# Patient Record
Sex: Female | Born: 1935 | Race: White | Hispanic: No | Smoking: Former smoker
Health system: Southern US, Community
[De-identification: ages and names within clinical notes are randomized; demographics above are authoritative.]

## PROBLEM LIST (undated history)

## (undated) DIAGNOSIS — H409 Unspecified glaucoma: Secondary | ICD-10-CM

## (undated) DIAGNOSIS — K219 Gastro-esophageal reflux disease without esophagitis: Secondary | ICD-10-CM

## (undated) DIAGNOSIS — I10 Essential (primary) hypertension: Secondary | ICD-10-CM

## (undated) DIAGNOSIS — E079 Disorder of thyroid, unspecified: Secondary | ICD-10-CM

## (undated) DIAGNOSIS — H3530 Unspecified macular degeneration: Secondary | ICD-10-CM

## (undated) HISTORY — PX: ABDOMINAL HYSTERECTOMY: SHX81

## (undated) HISTORY — PX: TONSILLECTOMY: SUR1361

## (undated) HISTORY — DX: Gastro-esophageal reflux disease without esophagitis: K21.9

## (undated) HISTORY — DX: Essential (primary) hypertension: I10

## (undated) HISTORY — DX: Disorder of thyroid, unspecified: E07.9

---

## 2002-08-04 ENCOUNTER — Other Ambulatory Visit: Admission: RE | Admit: 2002-08-04 | Discharge: 2002-08-04 | Payer: Self-pay | Admitting: Family Medicine

## 2004-12-03 ENCOUNTER — Ambulatory Visit: Payer: Self-pay | Admitting: Family Medicine

## 2004-12-14 ENCOUNTER — Ambulatory Visit: Payer: Self-pay | Admitting: Internal Medicine

## 2004-12-24 ENCOUNTER — Ambulatory Visit: Payer: Self-pay | Admitting: Family Medicine

## 2004-12-27 ENCOUNTER — Ambulatory Visit: Payer: Self-pay | Admitting: Family Medicine

## 2005-01-11 ENCOUNTER — Ambulatory Visit: Payer: Self-pay | Admitting: Family Medicine

## 2006-04-03 ENCOUNTER — Ambulatory Visit: Payer: Self-pay | Admitting: Family Medicine

## 2006-04-04 ENCOUNTER — Ambulatory Visit: Payer: Self-pay | Admitting: Family Medicine

## 2006-04-23 ENCOUNTER — Ambulatory Visit: Payer: Self-pay | Admitting: Family Medicine

## 2006-07-30 ENCOUNTER — Ambulatory Visit: Payer: Self-pay | Admitting: Family Medicine

## 2007-04-09 ENCOUNTER — Ambulatory Visit: Payer: Self-pay | Admitting: Family Medicine

## 2008-05-31 ENCOUNTER — Ambulatory Visit: Payer: Self-pay | Admitting: Family Medicine

## 2009-07-20 ENCOUNTER — Ambulatory Visit: Payer: Self-pay | Admitting: Family Medicine

## 2010-03-24 ENCOUNTER — Emergency Department: Payer: Self-pay | Admitting: Internal Medicine

## 2010-03-30 ENCOUNTER — Ambulatory Visit: Payer: Self-pay | Admitting: Ophthalmology

## 2010-09-25 ENCOUNTER — Ambulatory Visit: Payer: Self-pay | Admitting: Family Medicine

## 2011-04-29 ENCOUNTER — Inpatient Hospital Stay: Payer: Self-pay | Admitting: Internal Medicine

## 2011-05-07 LAB — PATHOLOGY REPORT

## 2012-01-01 ENCOUNTER — Ambulatory Visit: Payer: Self-pay | Admitting: Family Medicine

## 2013-06-04 ENCOUNTER — Ambulatory Visit: Payer: Self-pay | Admitting: Family Medicine

## 2013-11-01 ENCOUNTER — Ambulatory Visit: Payer: Self-pay | Admitting: Podiatry

## 2013-11-04 ENCOUNTER — Encounter: Payer: Self-pay | Admitting: Podiatry

## 2013-11-08 ENCOUNTER — Ambulatory Visit (INDEPENDENT_AMBULATORY_CARE_PROVIDER_SITE_OTHER): Payer: Medicare Other | Admitting: Podiatry

## 2013-11-08 ENCOUNTER — Encounter: Payer: Self-pay | Admitting: Podiatry

## 2013-11-08 VITALS — BP 151/72 | HR 70 | Resp 18 | Ht 64.0 in | Wt 143.0 lb

## 2013-11-08 DIAGNOSIS — M79609 Pain in unspecified limb: Secondary | ICD-10-CM

## 2013-11-08 DIAGNOSIS — B351 Tinea unguium: Secondary | ICD-10-CM

## 2013-11-08 NOTE — Progress Notes (Signed)
Brittany Blackwell presents today with a chief complaint of painful elongated toenails bilateral. ° °Objective: Pulses remain palpable bilateral nails are thick yellow dystrophic clinically mycotic. ° °Assessment: Pain in limb secondary onychomycosis 1 through 5 bilateral. ° °Plan: Debridement of nails 1 through 5 bilateral followup with her in 3 months. °

## 2014-02-07 ENCOUNTER — Ambulatory Visit (INDEPENDENT_AMBULATORY_CARE_PROVIDER_SITE_OTHER): Payer: Medicare HMO | Admitting: Podiatry

## 2014-02-07 VITALS — BP 146/73 | HR 80 | Resp 16 | Ht 64.0 in | Wt 145.0 lb

## 2014-02-07 DIAGNOSIS — M79609 Pain in unspecified limb: Secondary | ICD-10-CM

## 2014-02-07 DIAGNOSIS — B351 Tinea unguium: Secondary | ICD-10-CM

## 2014-02-07 NOTE — Progress Notes (Signed)
She presents today with a chief complaint of painful toenails one through 5 bilateral.  Objective: Vital signs are stable she is alert and oriented x3. Nails are thick yellow dystrophic onychomycotic and painful palpation as well as debridement.  Assessment: Pain in limb secondary to onychomycosis 1 through 5 bilateral.  Plan: Debridement of nails 1 through 5 bilateral covered service secondary to pain.

## 2014-05-04 ENCOUNTER — Encounter: Payer: Self-pay | Admitting: Podiatry

## 2014-05-04 ENCOUNTER — Ambulatory Visit (INDEPENDENT_AMBULATORY_CARE_PROVIDER_SITE_OTHER): Payer: Medicare HMO | Admitting: Podiatry

## 2014-05-04 VITALS — BP 120/86 | HR 76 | Resp 18

## 2014-05-04 DIAGNOSIS — M79609 Pain in unspecified limb: Secondary | ICD-10-CM

## 2014-05-04 DIAGNOSIS — B351 Tinea unguium: Secondary | ICD-10-CM

## 2014-05-04 NOTE — Progress Notes (Signed)
She presents today with a chief complaint of painful elongated toenails one through 5 bilateral.  Objective: Nails are thick yellow dystrophic onychomycotic and painful palpation. Pulses are palpable bilateral.  Assessment: Pain in limb secondary to onychomycosis 1 through 5 bilateral.  Plan: Debridement of nails 1 through 5 bilateral covered service secondary to pain.

## 2014-08-01 ENCOUNTER — Ambulatory Visit (INDEPENDENT_AMBULATORY_CARE_PROVIDER_SITE_OTHER): Payer: Medicare HMO | Admitting: Podiatry

## 2014-08-01 DIAGNOSIS — M79676 Pain in unspecified toe(s): Secondary | ICD-10-CM

## 2014-08-01 DIAGNOSIS — B351 Tinea unguium: Secondary | ICD-10-CM

## 2014-08-01 DIAGNOSIS — M79609 Pain in unspecified limb: Secondary | ICD-10-CM

## 2014-08-01 NOTE — Progress Notes (Signed)
She presents today with a chief complaint of painful elongated toenails one through 5 bilateral.  Objective: Nails are thick yellow dystrophic onychomycotic and painful palpation.  Assessment: Pain in limb secondary to onychomycosis 1 through 5 bilateral.  Plan: Debridement nails 1 through 5 bilateral.

## 2014-11-02 ENCOUNTER — Ambulatory Visit: Payer: Medicare HMO | Admitting: Podiatry

## 2014-11-02 ENCOUNTER — Ambulatory Visit: Payer: Commercial Managed Care - HMO | Admitting: Podiatry

## 2014-11-14 ENCOUNTER — Ambulatory Visit: Payer: Commercial Managed Care - HMO | Admitting: Podiatry

## 2014-11-15 ENCOUNTER — Ambulatory Visit: Payer: Self-pay | Admitting: Family Medicine

## 2014-11-23 ENCOUNTER — Ambulatory Visit (INDEPENDENT_AMBULATORY_CARE_PROVIDER_SITE_OTHER): Payer: Commercial Managed Care - HMO | Admitting: Podiatry

## 2014-11-23 DIAGNOSIS — B351 Tinea unguium: Secondary | ICD-10-CM

## 2014-11-23 DIAGNOSIS — M79676 Pain in unspecified toe(s): Secondary | ICD-10-CM

## 2014-11-23 NOTE — Progress Notes (Signed)
Joory presents today with a chief complaint of painful elongated toenails bilateral. ° °Objective: Pulses remain palpable bilateral nails are thick yellow dystrophic clinically mycotic. ° °Assessment: Pain in limb secondary onychomycosis 1 through 5 bilateral. ° °Plan: Debridement of nails 1 through 5 bilateral followup with her in 3 months. °

## 2015-02-27 ENCOUNTER — Other Ambulatory Visit: Payer: Commercial Managed Care - HMO

## 2015-02-27 ENCOUNTER — Ambulatory Visit: Payer: Commercial Managed Care - HMO | Admitting: Podiatry

## 2015-04-10 ENCOUNTER — Ambulatory Visit (INDEPENDENT_AMBULATORY_CARE_PROVIDER_SITE_OTHER): Payer: PPO | Admitting: Podiatry

## 2015-04-10 DIAGNOSIS — B351 Tinea unguium: Secondary | ICD-10-CM

## 2015-04-10 DIAGNOSIS — M79676 Pain in unspecified toe(s): Secondary | ICD-10-CM

## 2015-04-10 NOTE — Progress Notes (Signed)
Brittany Blackwell presents today with a chief complaint of painful elongated toenails bilateral.  Objective: Pulses remain palpable bilateral nails are thick yellow dystrophic clinically mycotic.  Assessment: Pain in limb secondary onychomycosis 1 through 5 bilateral.  Plan: Debridement of nails 1 through 5 bilateral followup with her in 3 months.

## 2015-07-31 ENCOUNTER — Ambulatory Visit: Payer: PPO

## 2015-09-13 ENCOUNTER — Encounter: Payer: Self-pay | Admitting: Podiatry

## 2015-09-13 ENCOUNTER — Ambulatory Visit (INDEPENDENT_AMBULATORY_CARE_PROVIDER_SITE_OTHER): Payer: PPO | Admitting: Podiatry

## 2015-09-13 DIAGNOSIS — B351 Tinea unguium: Secondary | ICD-10-CM | POA: Diagnosis not present

## 2015-09-13 DIAGNOSIS — M79676 Pain in unspecified toe(s): Secondary | ICD-10-CM | POA: Diagnosis not present

## 2015-09-13 NOTE — Progress Notes (Signed)
She presents today with chief complaint of painful elongated toenails.  Objective: Vital signs are stable she is alert and oriented 3. Pulses are strongly palpable bilateral. Her nails are thick yellow dystrophic onychomycotic and painful on palpation was sharply incurvated nail margins.  Assessment: Pain in limb secondary to onychomycosis and sharp incurvated nail margins.  Plan: Discussed etiology pathology conservative versus surgical therapies. Debrided nails 1 through 5 bilateral is a covered service secondary to pain.

## 2015-12-13 ENCOUNTER — Ambulatory Visit: Payer: PPO | Admitting: Podiatry

## 2016-01-03 ENCOUNTER — Ambulatory Visit (INDEPENDENT_AMBULATORY_CARE_PROVIDER_SITE_OTHER): Payer: PPO | Admitting: Podiatry

## 2016-01-03 ENCOUNTER — Encounter: Payer: Self-pay | Admitting: Podiatry

## 2016-01-03 DIAGNOSIS — M79674 Pain in right toe(s): Secondary | ICD-10-CM | POA: Diagnosis not present

## 2016-01-03 DIAGNOSIS — E039 Hypothyroidism, unspecified: Secondary | ICD-10-CM | POA: Insufficient documentation

## 2016-01-03 DIAGNOSIS — H3530 Unspecified macular degeneration: Secondary | ICD-10-CM | POA: Insufficient documentation

## 2016-01-03 DIAGNOSIS — B351 Tinea unguium: Secondary | ICD-10-CM

## 2016-01-03 DIAGNOSIS — E079 Disorder of thyroid, unspecified: Secondary | ICD-10-CM | POA: Insufficient documentation

## 2016-01-03 DIAGNOSIS — M79675 Pain in left toe(s): Secondary | ICD-10-CM | POA: Diagnosis not present

## 2016-01-03 DIAGNOSIS — I10 Essential (primary) hypertension: Secondary | ICD-10-CM | POA: Insufficient documentation

## 2016-01-03 DIAGNOSIS — B019 Varicella without complication: Secondary | ICD-10-CM | POA: Insufficient documentation

## 2016-01-03 NOTE — Progress Notes (Signed)
She presents today with chief complaint of painful elongated toenails.  Objective: Vital signs are stable alert and oriented 3. Pulses are palpable. Toenails are thick yellow dystrophic onychomycotic and painful on palpation.  Assessment: Pain in limb secondary to onychomycosis.  Plan: Debridement of toenails 1 through 5 bilateral cover service secondary to pain.

## 2016-03-06 DIAGNOSIS — H401132 Primary open-angle glaucoma, bilateral, moderate stage: Secondary | ICD-10-CM | POA: Diagnosis not present

## 2016-04-01 DIAGNOSIS — D649 Anemia, unspecified: Secondary | ICD-10-CM | POA: Diagnosis not present

## 2016-04-03 ENCOUNTER — Ambulatory Visit: Payer: PPO | Admitting: Podiatry

## 2016-04-04 DIAGNOSIS — E039 Hypothyroidism, unspecified: Secondary | ICD-10-CM | POA: Diagnosis not present

## 2016-04-04 DIAGNOSIS — R5383 Other fatigue: Secondary | ICD-10-CM | POA: Diagnosis not present

## 2016-04-04 DIAGNOSIS — R5381 Other malaise: Secondary | ICD-10-CM | POA: Diagnosis not present

## 2016-04-04 DIAGNOSIS — E538 Deficiency of other specified B group vitamins: Secondary | ICD-10-CM | POA: Diagnosis not present

## 2016-04-16 DIAGNOSIS — K52831 Collagenous colitis: Secondary | ICD-10-CM | POA: Diagnosis not present

## 2016-04-22 DIAGNOSIS — K52831 Collagenous colitis: Secondary | ICD-10-CM | POA: Diagnosis not present

## 2016-04-24 ENCOUNTER — Ambulatory Visit: Payer: PPO | Admitting: Podiatry

## 2016-05-09 DIAGNOSIS — I10 Essential (primary) hypertension: Secondary | ICD-10-CM | POA: Diagnosis not present

## 2016-05-09 DIAGNOSIS — E039 Hypothyroidism, unspecified: Secondary | ICD-10-CM | POA: Diagnosis not present

## 2016-05-10 DIAGNOSIS — H353134 Nonexudative age-related macular degeneration, bilateral, advanced atrophic with subfoveal involvement: Secondary | ICD-10-CM | POA: Diagnosis not present

## 2016-05-20 ENCOUNTER — Ambulatory Visit: Payer: PPO | Admitting: Podiatry

## 2016-05-24 DIAGNOSIS — K52831 Collagenous colitis: Secondary | ICD-10-CM | POA: Diagnosis not present

## 2016-05-27 ENCOUNTER — Ambulatory Visit (INDEPENDENT_AMBULATORY_CARE_PROVIDER_SITE_OTHER): Payer: PPO | Admitting: Podiatry

## 2016-05-27 ENCOUNTER — Encounter: Payer: Self-pay | Admitting: Podiatry

## 2016-05-27 DIAGNOSIS — B351 Tinea unguium: Secondary | ICD-10-CM

## 2016-05-27 DIAGNOSIS — M79674 Pain in right toe(s): Secondary | ICD-10-CM | POA: Diagnosis not present

## 2016-05-27 DIAGNOSIS — M79675 Pain in left toe(s): Secondary | ICD-10-CM | POA: Diagnosis not present

## 2016-05-27 NOTE — Progress Notes (Signed)
She presents today with a chief complaint of painful elongated toenails 1 through 5 bilateral.  Objective: Pulses are palpable toenails are thick yellow dystrophic with mycotic sharply incurvated and painful.  Assessment: Pain in limb secondary to onychomycosis 1 through 5 bilateral.  Plan: Debridement of toenails 1 through 5 bilateral.

## 2016-08-05 DIAGNOSIS — E039 Hypothyroidism, unspecified: Secondary | ICD-10-CM | POA: Diagnosis not present

## 2016-08-12 DIAGNOSIS — I10 Essential (primary) hypertension: Secondary | ICD-10-CM | POA: Diagnosis not present

## 2016-08-12 DIAGNOSIS — E039 Hypothyroidism, unspecified: Secondary | ICD-10-CM | POA: Diagnosis not present

## 2016-08-28 ENCOUNTER — Ambulatory Visit: Payer: PPO | Admitting: Podiatry

## 2016-08-30 ENCOUNTER — Ambulatory Visit: Payer: PPO | Admitting: Podiatry

## 2016-09-18 ENCOUNTER — Ambulatory Visit: Payer: PPO | Admitting: Podiatry

## 2016-09-30 ENCOUNTER — Encounter: Payer: Self-pay | Admitting: Podiatry

## 2016-09-30 ENCOUNTER — Ambulatory Visit (INDEPENDENT_AMBULATORY_CARE_PROVIDER_SITE_OTHER): Payer: PPO | Admitting: Podiatry

## 2016-09-30 DIAGNOSIS — B351 Tinea unguium: Secondary | ICD-10-CM

## 2016-09-30 DIAGNOSIS — M79676 Pain in unspecified toe(s): Secondary | ICD-10-CM

## 2016-09-30 NOTE — Progress Notes (Signed)
She presents today with chief complaint of painful elongated toenails.  Objective: Toenails are thick yellow dystrophic with mycotic pulses are palpable no open lesions or wounds.  Assessment: Pain elicited onychomycosis.  Plan: Debridement of toenails 1 through 5 bilateral.

## 2016-10-16 DIAGNOSIS — H401132 Primary open-angle glaucoma, bilateral, moderate stage: Secondary | ICD-10-CM | POA: Diagnosis not present

## 2017-01-01 ENCOUNTER — Ambulatory Visit: Payer: PPO | Admitting: Podiatry

## 2017-01-13 DIAGNOSIS — I10 Essential (primary) hypertension: Secondary | ICD-10-CM | POA: Diagnosis not present

## 2017-01-13 DIAGNOSIS — E039 Hypothyroidism, unspecified: Secondary | ICD-10-CM | POA: Diagnosis not present

## 2017-01-15 ENCOUNTER — Encounter: Payer: Self-pay | Admitting: Podiatry

## 2017-01-15 ENCOUNTER — Ambulatory Visit (INDEPENDENT_AMBULATORY_CARE_PROVIDER_SITE_OTHER): Payer: PPO | Admitting: Podiatry

## 2017-01-15 DIAGNOSIS — M79676 Pain in unspecified toe(s): Secondary | ICD-10-CM

## 2017-01-15 DIAGNOSIS — B351 Tinea unguium: Secondary | ICD-10-CM

## 2017-01-15 NOTE — Progress Notes (Signed)
She presents today chief complaint of painful elongated toenails.  Objective: Vital signs are stable she is alert and oriented 3 pulses are palpable. Toenails are long thick yellow dystrophic onychomycotic painful palpation.  Assessment: Pain in limb secondary to onychomycosis.  Plan: Debridement of toenails bilateral.

## 2017-04-02 DIAGNOSIS — D649 Anemia, unspecified: Secondary | ICD-10-CM | POA: Diagnosis not present

## 2017-04-02 DIAGNOSIS — L309 Dermatitis, unspecified: Secondary | ICD-10-CM | POA: Diagnosis not present

## 2017-04-02 DIAGNOSIS — E039 Hypothyroidism, unspecified: Secondary | ICD-10-CM | POA: Diagnosis not present

## 2017-04-02 DIAGNOSIS — Z0000 Encounter for general adult medical examination without abnormal findings: Secondary | ICD-10-CM | POA: Diagnosis not present

## 2017-04-02 DIAGNOSIS — Z23 Encounter for immunization: Secondary | ICD-10-CM | POA: Diagnosis not present

## 2017-04-14 ENCOUNTER — Ambulatory Visit (INDEPENDENT_AMBULATORY_CARE_PROVIDER_SITE_OTHER): Payer: PPO | Admitting: Podiatry

## 2017-04-14 DIAGNOSIS — M79676 Pain in unspecified toe(s): Secondary | ICD-10-CM | POA: Diagnosis not present

## 2017-04-14 DIAGNOSIS — B351 Tinea unguium: Secondary | ICD-10-CM | POA: Diagnosis not present

## 2017-04-14 DIAGNOSIS — M79675 Pain in left toe(s): Secondary | ICD-10-CM

## 2017-04-14 DIAGNOSIS — M79674 Pain in right toe(s): Secondary | ICD-10-CM

## 2017-04-14 NOTE — Progress Notes (Signed)
Complaint:  Visit Type: Patient returns to my office for continued preventative foot care services. Complaint: Patient states" my nails have grown long and thick and become painful to walk and wear shoes"  The patient presents for preventative foot care services.   Podiatric Exam: Vascular: dorsalis pedis and posterior tibial pulses are palpable bilateral. Capillary return is immediate. Temperature gradient is WNL. Skin turgor WNL  Sensorium: Normal Semmes Weinstein monofilament test. Normal tactile sensation bilaterally. Nail Exam: Pt has thick disfigured discolored nails with subungual debris noted bilateral entire nail hallux through fifth toenails Ulcer Exam: There is no evidence of ulcer or pre-ulcerative changes or infection. Orthopedic Exam: Muscle tone and strength are WNL. No limitations in general ROM. No crepitus or effusions noted. Foot type and digits show no abnormalities. Bony prominences are unremarkable. Skin: No Porokeratosis. No infection or ulcers  Diagnosis:  Onychomycosis, , Pain in right toe, pain in left toes  Treatment & Plan Procedures and Treatment: Consent by patient was obtained for treatment procedures. The patient understood the discussion of treatment and procedures well. All questions were answered thoroughly reviewed. Debridement of mycotic and hypertrophic toenails, 1 through 5 bilateral and clearing of subungual debris. No ulceration, no infection noted.  Return Visit-Office Procedure: Patient instructed to return to the office for a follow up visit 3 months for continued evaluation and treatment.    Gardiner Barefoot DPM

## 2017-07-14 ENCOUNTER — Ambulatory Visit: Payer: PPO | Admitting: Podiatry

## 2017-07-28 ENCOUNTER — Ambulatory Visit: Payer: PPO | Admitting: Podiatry

## 2017-08-11 ENCOUNTER — Ambulatory Visit (INDEPENDENT_AMBULATORY_CARE_PROVIDER_SITE_OTHER): Payer: PPO | Admitting: Podiatry

## 2017-08-11 ENCOUNTER — Encounter: Payer: Self-pay | Admitting: Podiatry

## 2017-08-11 DIAGNOSIS — B351 Tinea unguium: Secondary | ICD-10-CM

## 2017-08-11 DIAGNOSIS — M79676 Pain in unspecified toe(s): Secondary | ICD-10-CM

## 2017-08-11 NOTE — Progress Notes (Signed)
She presents today chief complaint of painful elongated toenails.  Objective: Pulses are palpable. Tenderness along thick yellow dystrophic ointment mycotic painful palpation as well as debridement.  Assessment: Pain limb secondary to onychomycosis.  Plan: Debridement of toenails 1 through 5 bilateral.

## 2017-08-14 DIAGNOSIS — I872 Venous insufficiency (chronic) (peripheral): Secondary | ICD-10-CM | POA: Diagnosis not present

## 2017-10-23 DIAGNOSIS — D649 Anemia, unspecified: Secondary | ICD-10-CM | POA: Diagnosis not present

## 2017-10-23 DIAGNOSIS — E039 Hypothyroidism, unspecified: Secondary | ICD-10-CM | POA: Diagnosis not present

## 2017-10-23 DIAGNOSIS — N289 Disorder of kidney and ureter, unspecified: Secondary | ICD-10-CM | POA: Diagnosis not present

## 2017-11-10 ENCOUNTER — Encounter: Payer: Self-pay | Admitting: Podiatry

## 2017-11-10 ENCOUNTER — Ambulatory Visit: Payer: PPO | Admitting: Podiatry

## 2017-11-10 DIAGNOSIS — M79676 Pain in unspecified toe(s): Secondary | ICD-10-CM | POA: Diagnosis not present

## 2017-11-10 DIAGNOSIS — B351 Tinea unguium: Secondary | ICD-10-CM | POA: Diagnosis not present

## 2017-11-10 NOTE — Progress Notes (Signed)
She presents with a chief complaint of painful elongated toenails 1 through 5 bilaterally. She states my toenails are getting long and rubbing her shoes and they're painful.  Objective: Pulses are palpable. Toenails are long thick yellow dystrophic lytic mycotic painful palpation as well as debridement.  Assessment: Pain and limb secondary to onychomycosis 1 through 5 bilateral.  Plan: Debridement of toenails 1 through 5 bilateral foot.

## 2017-11-13 DIAGNOSIS — D485 Neoplasm of uncertain behavior of skin: Secondary | ICD-10-CM | POA: Diagnosis not present

## 2017-11-17 DIAGNOSIS — D649 Anemia, unspecified: Secondary | ICD-10-CM | POA: Diagnosis not present

## 2017-12-01 DIAGNOSIS — D485 Neoplasm of uncertain behavior of skin: Secondary | ICD-10-CM | POA: Diagnosis not present

## 2017-12-01 DIAGNOSIS — C44622 Squamous cell carcinoma of skin of right upper limb, including shoulder: Secondary | ICD-10-CM | POA: Diagnosis not present

## 2017-12-18 DIAGNOSIS — R197 Diarrhea, unspecified: Secondary | ICD-10-CM | POA: Diagnosis not present

## 2017-12-18 DIAGNOSIS — D649 Anemia, unspecified: Secondary | ICD-10-CM | POA: Diagnosis not present

## 2017-12-18 DIAGNOSIS — R079 Chest pain, unspecified: Secondary | ICD-10-CM | POA: Diagnosis not present

## 2017-12-18 DIAGNOSIS — E039 Hypothyroidism, unspecified: Secondary | ICD-10-CM | POA: Diagnosis not present

## 2017-12-18 DIAGNOSIS — R195 Other fecal abnormalities: Secondary | ICD-10-CM | POA: Diagnosis not present

## 2017-12-24 DIAGNOSIS — E079 Disorder of thyroid, unspecified: Secondary | ICD-10-CM | POA: Diagnosis not present

## 2017-12-24 DIAGNOSIS — I208 Other forms of angina pectoris: Secondary | ICD-10-CM | POA: Diagnosis not present

## 2017-12-24 DIAGNOSIS — H3530 Unspecified macular degeneration: Secondary | ICD-10-CM | POA: Diagnosis not present

## 2017-12-24 DIAGNOSIS — I10 Essential (primary) hypertension: Secondary | ICD-10-CM | POA: Diagnosis not present

## 2017-12-24 DIAGNOSIS — R011 Cardiac murmur, unspecified: Secondary | ICD-10-CM | POA: Diagnosis not present

## 2018-01-02 DIAGNOSIS — R011 Cardiac murmur, unspecified: Secondary | ICD-10-CM | POA: Diagnosis not present

## 2018-01-02 DIAGNOSIS — I208 Other forms of angina pectoris: Secondary | ICD-10-CM | POA: Diagnosis not present

## 2018-01-07 DIAGNOSIS — H3530 Unspecified macular degeneration: Secondary | ICD-10-CM | POA: Diagnosis not present

## 2018-01-07 DIAGNOSIS — I10 Essential (primary) hypertension: Secondary | ICD-10-CM | POA: Diagnosis not present

## 2018-01-07 DIAGNOSIS — I208 Other forms of angina pectoris: Secondary | ICD-10-CM | POA: Diagnosis not present

## 2018-01-07 DIAGNOSIS — E079 Disorder of thyroid, unspecified: Secondary | ICD-10-CM | POA: Diagnosis not present

## 2018-01-07 DIAGNOSIS — R011 Cardiac murmur, unspecified: Secondary | ICD-10-CM | POA: Diagnosis not present

## 2018-01-07 DIAGNOSIS — I272 Pulmonary hypertension, unspecified: Secondary | ICD-10-CM | POA: Diagnosis not present

## 2018-01-28 DIAGNOSIS — F419 Anxiety disorder, unspecified: Secondary | ICD-10-CM | POA: Diagnosis not present

## 2018-02-11 ENCOUNTER — Ambulatory Visit: Payer: PPO | Admitting: Podiatry

## 2018-03-04 ENCOUNTER — Ambulatory Visit: Payer: PPO | Admitting: Podiatry

## 2018-06-04 DIAGNOSIS — I872 Venous insufficiency (chronic) (peripheral): Secondary | ICD-10-CM | POA: Diagnosis not present

## 2018-07-06 DIAGNOSIS — M25572 Pain in left ankle and joints of left foot: Secondary | ICD-10-CM | POA: Diagnosis not present

## 2018-07-06 DIAGNOSIS — M79672 Pain in left foot: Secondary | ICD-10-CM | POA: Diagnosis not present

## 2018-08-03 DIAGNOSIS — H353134 Nonexudative age-related macular degeneration, bilateral, advanced atrophic with subfoveal involvement: Secondary | ICD-10-CM | POA: Diagnosis not present

## 2018-09-14 ENCOUNTER — Encounter: Payer: Self-pay | Admitting: Podiatry

## 2018-09-14 ENCOUNTER — Ambulatory Visit: Payer: PPO | Admitting: Podiatry

## 2018-09-14 DIAGNOSIS — B351 Tinea unguium: Secondary | ICD-10-CM | POA: Diagnosis not present

## 2018-09-14 DIAGNOSIS — M79676 Pain in unspecified toe(s): Secondary | ICD-10-CM

## 2018-09-14 NOTE — Progress Notes (Signed)
She presents today chief complaint of painful elongated toenails.  Objective: Nails are long thick yellow dystrophic-like mycotic pulses are palpable.  No open lesions or wounds are noted.  Assessment: Pain in limb secondary to onychomycosis.  Plan: Debridement of toenails 1 through 5 bilateral.

## 2018-09-28 DIAGNOSIS — H353134 Nonexudative age-related macular degeneration, bilateral, advanced atrophic with subfoveal involvement: Secondary | ICD-10-CM | POA: Diagnosis not present

## 2018-09-28 DIAGNOSIS — H401134 Primary open-angle glaucoma, bilateral, indeterminate stage: Secondary | ICD-10-CM | POA: Diagnosis not present

## 2018-10-13 DIAGNOSIS — R600 Localized edema: Secondary | ICD-10-CM | POA: Diagnosis not present

## 2018-10-13 DIAGNOSIS — I10 Essential (primary) hypertension: Secondary | ICD-10-CM | POA: Diagnosis not present

## 2018-10-28 DIAGNOSIS — E079 Disorder of thyroid, unspecified: Secondary | ICD-10-CM | POA: Diagnosis not present

## 2018-10-28 DIAGNOSIS — I272 Pulmonary hypertension, unspecified: Secondary | ICD-10-CM | POA: Diagnosis not present

## 2018-10-28 DIAGNOSIS — I10 Essential (primary) hypertension: Secondary | ICD-10-CM | POA: Diagnosis not present

## 2018-10-28 DIAGNOSIS — R011 Cardiac murmur, unspecified: Secondary | ICD-10-CM | POA: Diagnosis not present

## 2018-10-28 DIAGNOSIS — F22 Delusional disorders: Secondary | ICD-10-CM | POA: Diagnosis not present

## 2018-10-28 DIAGNOSIS — I208 Other forms of angina pectoris: Secondary | ICD-10-CM | POA: Diagnosis not present

## 2018-10-29 DIAGNOSIS — H401130 Primary open-angle glaucoma, bilateral, stage unspecified: Secondary | ICD-10-CM | POA: Diagnosis not present

## 2018-12-07 ENCOUNTER — Ambulatory Visit: Payer: PPO | Admitting: Podiatry

## 2019-01-19 DIAGNOSIS — R441 Visual hallucinations: Secondary | ICD-10-CM | POA: Diagnosis not present

## 2019-01-19 DIAGNOSIS — H3530 Unspecified macular degeneration: Secondary | ICD-10-CM | POA: Diagnosis not present

## 2019-01-19 DIAGNOSIS — H5316 Psychophysical visual disturbances: Secondary | ICD-10-CM | POA: Diagnosis not present

## 2019-01-19 DIAGNOSIS — Z79899 Other long term (current) drug therapy: Secondary | ICD-10-CM | POA: Diagnosis not present

## 2019-01-19 DIAGNOSIS — R4189 Other symptoms and signs involving cognitive functions and awareness: Secondary | ICD-10-CM | POA: Diagnosis not present

## 2019-02-02 DIAGNOSIS — Z6824 Body mass index (BMI) 24.0-24.9, adult: Secondary | ICD-10-CM | POA: Diagnosis not present

## 2019-02-02 DIAGNOSIS — K51919 Ulcerative colitis, unspecified with unspecified complications: Secondary | ICD-10-CM | POA: Diagnosis not present

## 2019-02-02 DIAGNOSIS — Z7982 Long term (current) use of aspirin: Secondary | ICD-10-CM | POA: Diagnosis not present

## 2019-02-02 DIAGNOSIS — Z961 Presence of intraocular lens: Secondary | ICD-10-CM | POA: Diagnosis not present

## 2019-02-02 DIAGNOSIS — M40294 Other kyphosis, thoracic region: Secondary | ICD-10-CM | POA: Diagnosis not present

## 2019-02-02 DIAGNOSIS — H4089 Other specified glaucoma: Secondary | ICD-10-CM | POA: Diagnosis not present

## 2019-02-02 DIAGNOSIS — H3530 Unspecified macular degeneration: Secondary | ICD-10-CM | POA: Diagnosis not present

## 2019-02-02 DIAGNOSIS — Z853 Personal history of malignant neoplasm of breast: Secondary | ICD-10-CM | POA: Diagnosis not present

## 2019-02-02 DIAGNOSIS — B029 Zoster without complications: Secondary | ICD-10-CM | POA: Diagnosis not present

## 2019-02-02 DIAGNOSIS — I10 Essential (primary) hypertension: Secondary | ICD-10-CM | POA: Diagnosis not present

## 2019-02-02 DIAGNOSIS — H25019 Cortical age-related cataract, unspecified eye: Secondary | ICD-10-CM | POA: Diagnosis not present

## 2019-02-02 DIAGNOSIS — Z9049 Acquired absence of other specified parts of digestive tract: Secondary | ICD-10-CM | POA: Diagnosis not present

## 2019-02-02 DIAGNOSIS — Z90710 Acquired absence of both cervix and uterus: Secondary | ICD-10-CM | POA: Diagnosis not present

## 2019-02-02 DIAGNOSIS — E039 Hypothyroidism, unspecified: Secondary | ICD-10-CM | POA: Diagnosis not present

## 2019-02-02 DIAGNOSIS — Z87891 Personal history of nicotine dependence: Secondary | ICD-10-CM | POA: Diagnosis not present

## 2019-02-02 DIAGNOSIS — R4189 Other symptoms and signs involving cognitive functions and awareness: Secondary | ICD-10-CM | POA: Diagnosis not present

## 2019-02-02 DIAGNOSIS — G3184 Mild cognitive impairment, so stated: Secondary | ICD-10-CM | POA: Diagnosis not present

## 2019-03-04 DIAGNOSIS — I10 Essential (primary) hypertension: Secondary | ICD-10-CM | POA: Diagnosis not present

## 2019-03-04 DIAGNOSIS — G3184 Mild cognitive impairment, so stated: Secondary | ICD-10-CM | POA: Diagnosis not present

## 2019-03-04 DIAGNOSIS — B029 Zoster without complications: Secondary | ICD-10-CM | POA: Diagnosis not present

## 2019-03-04 DIAGNOSIS — Z7982 Long term (current) use of aspirin: Secondary | ICD-10-CM | POA: Diagnosis not present

## 2019-03-04 DIAGNOSIS — R4189 Other symptoms and signs involving cognitive functions and awareness: Secondary | ICD-10-CM | POA: Diagnosis not present

## 2019-03-04 DIAGNOSIS — H3530 Unspecified macular degeneration: Secondary | ICD-10-CM | POA: Diagnosis not present

## 2019-03-04 DIAGNOSIS — E039 Hypothyroidism, unspecified: Secondary | ICD-10-CM | POA: Diagnosis not present

## 2019-03-04 DIAGNOSIS — Z961 Presence of intraocular lens: Secondary | ICD-10-CM | POA: Diagnosis not present

## 2019-03-04 DIAGNOSIS — H4089 Other specified glaucoma: Secondary | ICD-10-CM | POA: Diagnosis not present

## 2019-03-04 DIAGNOSIS — Z853 Personal history of malignant neoplasm of breast: Secondary | ICD-10-CM | POA: Diagnosis not present

## 2019-03-04 DIAGNOSIS — Z90710 Acquired absence of both cervix and uterus: Secondary | ICD-10-CM | POA: Diagnosis not present

## 2019-03-04 DIAGNOSIS — Z87891 Personal history of nicotine dependence: Secondary | ICD-10-CM | POA: Diagnosis not present

## 2019-03-04 DIAGNOSIS — Z9049 Acquired absence of other specified parts of digestive tract: Secondary | ICD-10-CM | POA: Diagnosis not present

## 2019-03-04 DIAGNOSIS — H25019 Cortical age-related cataract, unspecified eye: Secondary | ICD-10-CM | POA: Diagnosis not present

## 2019-03-04 DIAGNOSIS — M40294 Other kyphosis, thoracic region: Secondary | ICD-10-CM | POA: Diagnosis not present

## 2019-03-04 DIAGNOSIS — Z6824 Body mass index (BMI) 24.0-24.9, adult: Secondary | ICD-10-CM | POA: Diagnosis not present

## 2019-03-04 DIAGNOSIS — K51919 Ulcerative colitis, unspecified with unspecified complications: Secondary | ICD-10-CM | POA: Diagnosis not present

## 2019-03-31 ENCOUNTER — Other Ambulatory Visit: Payer: Self-pay

## 2019-03-31 ENCOUNTER — Emergency Department: Payer: PPO

## 2019-03-31 ENCOUNTER — Encounter: Payer: Self-pay | Admitting: Emergency Medicine

## 2019-03-31 ENCOUNTER — Inpatient Hospital Stay
Admission: EM | Admit: 2019-03-31 | Discharge: 2019-04-05 | DRG: 482 | Disposition: A | Discharge status: Skilled Nursing Facility | Payer: PPO | Attending: Internal Medicine | Admitting: Internal Medicine

## 2019-03-31 DIAGNOSIS — S299XXA Unspecified injury of thorax, initial encounter: Secondary | ICD-10-CM | POA: Diagnosis not present

## 2019-03-31 DIAGNOSIS — Z03818 Encounter for observation for suspected exposure to other biological agents ruled out: Secondary | ICD-10-CM

## 2019-03-31 DIAGNOSIS — Z79890 Hormone replacement therapy (postmenopausal): Secondary | ICD-10-CM | POA: Diagnosis not present

## 2019-03-31 DIAGNOSIS — Z01818 Encounter for other preprocedural examination: Secondary | ICD-10-CM | POA: Diagnosis not present

## 2019-03-31 DIAGNOSIS — Y92000 Kitchen of unspecified non-institutional (private) residence as  the place of occurrence of the external cause: Secondary | ICD-10-CM | POA: Diagnosis not present

## 2019-03-31 DIAGNOSIS — I10 Essential (primary) hypertension: Secondary | ICD-10-CM | POA: Diagnosis not present

## 2019-03-31 DIAGNOSIS — S72001D Fracture of unspecified part of neck of right femur, subsequent encounter for closed fracture with routine healing: Secondary | ICD-10-CM | POA: Diagnosis not present

## 2019-03-31 DIAGNOSIS — E079 Disorder of thyroid, unspecified: Secondary | ICD-10-CM | POA: Diagnosis present

## 2019-03-31 DIAGNOSIS — Z8249 Family history of ischemic heart disease and other diseases of the circulatory system: Secondary | ICD-10-CM

## 2019-03-31 DIAGNOSIS — K219 Gastro-esophageal reflux disease without esophagitis: Secondary | ICD-10-CM | POA: Diagnosis present

## 2019-03-31 DIAGNOSIS — S72012A Unspecified intracapsular fracture of left femur, initial encounter for closed fracture: Secondary | ICD-10-CM

## 2019-03-31 DIAGNOSIS — S72011A Unspecified intracapsular fracture of right femur, initial encounter for closed fracture: Secondary | ICD-10-CM | POA: Diagnosis not present

## 2019-03-31 DIAGNOSIS — R52 Pain, unspecified: Secondary | ICD-10-CM | POA: Diagnosis not present

## 2019-03-31 DIAGNOSIS — Z79899 Other long term (current) drug therapy: Secondary | ICD-10-CM

## 2019-03-31 DIAGNOSIS — Z885 Allergy status to narcotic agent status: Secondary | ICD-10-CM

## 2019-03-31 DIAGNOSIS — H409 Unspecified glaucoma: Secondary | ICD-10-CM | POA: Diagnosis not present

## 2019-03-31 DIAGNOSIS — Z7401 Bed confinement status: Secondary | ICD-10-CM | POA: Diagnosis not present

## 2019-03-31 DIAGNOSIS — H3530 Unspecified macular degeneration: Secondary | ICD-10-CM | POA: Diagnosis present

## 2019-03-31 DIAGNOSIS — R58 Hemorrhage, not elsewhere classified: Secondary | ICD-10-CM | POA: Diagnosis not present

## 2019-03-31 DIAGNOSIS — R0902 Hypoxemia: Secondary | ICD-10-CM | POA: Diagnosis not present

## 2019-03-31 DIAGNOSIS — S72009A Fracture of unspecified part of neck of unspecified femur, initial encounter for closed fracture: Secondary | ICD-10-CM | POA: Diagnosis not present

## 2019-03-31 DIAGNOSIS — M2550 Pain in unspecified joint: Secondary | ICD-10-CM | POA: Diagnosis not present

## 2019-03-31 DIAGNOSIS — W010XXA Fall on same level from slipping, tripping and stumbling without subsequent striking against object, initial encounter: Secondary | ICD-10-CM | POA: Diagnosis present

## 2019-03-31 DIAGNOSIS — E039 Hypothyroidism, unspecified: Secondary | ICD-10-CM | POA: Diagnosis not present

## 2019-03-31 DIAGNOSIS — S72001A Fracture of unspecified part of neck of right femur, initial encounter for closed fracture: Secondary | ICD-10-CM | POA: Diagnosis not present

## 2019-03-31 DIAGNOSIS — W19XXXA Unspecified fall, initial encounter: Secondary | ICD-10-CM | POA: Diagnosis not present

## 2019-03-31 DIAGNOSIS — Z9049 Acquired absence of other specified parts of digestive tract: Secondary | ICD-10-CM | POA: Diagnosis not present

## 2019-03-31 DIAGNOSIS — R450 Nervousness: Secondary | ICD-10-CM | POA: Diagnosis not present

## 2019-03-31 DIAGNOSIS — Z1159 Encounter for screening for other viral diseases: Secondary | ICD-10-CM | POA: Diagnosis not present

## 2019-03-31 DIAGNOSIS — I959 Hypotension, unspecified: Secondary | ICD-10-CM | POA: Diagnosis not present

## 2019-03-31 DIAGNOSIS — Z419 Encounter for procedure for purposes other than remedying health state, unspecified: Secondary | ICD-10-CM

## 2019-03-31 MED ORDER — OXYCODONE HCL 5 MG PO TABS: 5.0000 mg | ORAL_TABLET | ORAL | Status: DC | PRN

## 2019-03-31 MED ORDER — LATANOPROST 0.005 % OP SOLN
1.0000 [drp] | Freq: Every day | OPHTHALMIC | Status: DC
  Administered 2019-04-01 – 2019-04-04 (×5): 1 [drp] via OPHTHALMIC
  Filled 2019-03-31: qty 2.5

## 2019-03-31 MED ORDER — MORPHINE SULFATE (PF) 2 MG/ML IV SOLN: 2.0000 mg | INTRAVENOUS | Status: DC | PRN

## 2019-03-31 MED ORDER — AMLODIPINE BESYLATE 5 MG PO TABS
5.0000 mg | ORAL_TABLET | Freq: Every day | ORAL | Status: DC
  Administered 2019-04-02 – 2019-04-05 (×4): 5 mg via ORAL
  Filled 2019-03-31 (×5): qty 1

## 2019-03-31 MED ORDER — ATENOLOL 50 MG PO TABS
50.0000 mg | ORAL_TABLET | Freq: Every day | ORAL | Status: DC
  Administered 2019-04-01 – 2019-04-05 (×5): 50 mg via ORAL
  Filled 2019-03-31 (×5): qty 1

## 2019-03-31 MED ORDER — ALPRAZOLAM 0.25 MG PO TABS: 0.2500 mg | ORAL_TABLET | Freq: Every day | ORAL | Status: DC | PRN

## 2019-03-31 MED ORDER — BUDESONIDE 3 MG PO CPEP
9.0000 mg | ORAL_CAPSULE | Freq: Every day | ORAL | Status: DC
  Administered 2019-04-02 – 2019-04-05 (×4): 9 mg via ORAL
  Filled 2019-03-31 (×5): qty 3

## 2019-03-31 MED ORDER — THYROID 60 MG PO TABS
90.0000 mg | ORAL_TABLET | Freq: Every day | ORAL | Status: DC
  Administered 2019-04-02 – 2019-04-05 (×4): 90 mg via ORAL
  Filled 2019-03-31 (×5): qty 1

## 2019-03-31 MED ORDER — ACETAMINOPHEN 325 MG PO TABS
650.0000 mg | ORAL_TABLET | Freq: Four times a day (QID) | ORAL | Status: DC | PRN
  Administered 2019-04-05: 12:00:00 650 mg via ORAL
  Filled 2019-03-31 (×2): qty 2

## 2019-03-31 MED ORDER — FUROSEMIDE 20 MG PO TABS
20.0000 mg | ORAL_TABLET | Freq: Every day | ORAL | Status: DC
  Administered 2019-04-02 – 2019-04-05 (×4): 20 mg via ORAL
  Filled 2019-03-31 (×4): qty 1

## 2019-03-31 MED ORDER — ONDANSETRON HCL 4 MG PO TABS: 4.0000 mg | ORAL_TABLET | Freq: Four times a day (QID) | ORAL | Status: DC | PRN

## 2019-03-31 MED ORDER — ONDANSETRON HCL 4 MG/2ML IJ SOLN: 4.0000 mg | Freq: Four times a day (QID) | INTRAMUSCULAR | Status: DC | PRN

## 2019-03-31 MED ORDER — ACETAMINOPHEN 650 MG RE SUPP: 650.0000 mg | Freq: Four times a day (QID) | RECTAL | Status: DC | PRN

## 2019-03-31 MED ORDER — BRIMONIDINE TARTRATE-TIMOLOL 0.2-0.5 % OP SOLN
1.0000 [drp] | Freq: Two times a day (BID) | OPHTHALMIC | Status: DC
  Filled 2019-03-31: qty 5

## 2019-03-31 MED ORDER — SODIUM CHLORIDE 0.9 % IV SOLN
INTRAVENOUS | Status: DC
  Administered 2019-03-31: via INTRAVENOUS

## 2019-03-31 MED ORDER — TIMOLOL MALEATE 0.5 % OP SOLN
1.0000 [drp] | Freq: Every day | OPHTHALMIC | Status: DC
  Filled 2019-03-31: qty 5

## 2019-03-31 NOTE — ED Provider Notes (Signed)
[facility] Emergency Department Provider Note  ____________________________________________  Time seen: Approximately 8:15 PM  I have reviewed the triage vital signs and the nursing notes.   HISTORY  Chief Complaint Fall    HPI Brittany Blackwell is a 85 y.o. female a history of hypertension who was in her usual state of health at home when she was walking in her kitchen and tripped over her shoes causing her to fall onto her right side.  She had sudden onset of right hip pain afterward, nonradiating, worse with movement, no alleviating factors, moderate to severe in intensity.  No head injury neck pain or loss of consciousness.  Does not take any blood thinners.      Past Medical History:  Diagnosis Date  . Acid reflux   . Hypertension   . Thyroid disease      Patient Active Problem List   Diagnosis Date Noted  . Chicken pox 01/03/2016  . BP (high blood pressure) 01/03/2016  . Degeneration macular 01/03/2016  . Disease of thyroid gland 01/03/2016     History reviewed. No pertinent surgical history.   Prior to Admission medications   Medication Sig Start Date End Date Taking? Authorizing Provider  amLODipine (NORVASC) 5 MG tablet Take 5 mg by mouth daily.    [provider]  atenolol (TENORMIN) 50 MG tablet Take 50 mg by mouth daily.    [provider]  furosemide (LASIX) 20 MG tablet Take 20 mg by mouth daily.    [provider]  LUMIGAN 0.01 % SOLN  04/04/14   [provider]  potassium chloride SA (K-DUR,KLOR-CON) 20 MEQ tablet  02/21/14   [provider]  thyroid (ARMOUR THYROID) 90 MG tablet Take 90 mg by mouth daily.    [provider]     Allergies Demerol [meperidine]   History reviewed. No pertinent family history.  Social History Social History   Tobacco Use  . Smoking status: Never Smoker  . Smokeless tobacco: Never Used  Substance Use Topics  . Alcohol use: No  . Drug  use: No    Review of Systems  Constitutional:   No fever or chills.  ENT:   No sore throat. No rhinorrhea. Cardiovascular:   No chest pain or syncope. Respiratory:   No dyspnea or cough. Gastrointestinal:   Negative for abdominal pain, vomiting and diarrhea.  Musculoskeletal: Right hip pain as above. All other systems reviewed and are negative except as documented above in ROS and HPI.  ____________________________________________   PHYSICAL EXAM:  VITAL SIGNS: ED Triage Vitals  Enc Vitals Group     BP 03/31/19 2003 (!) 159/106     Pulse Rate 03/31/19 2003 89     Resp 03/31/19 2003 18     Temp 03/31/19 1908 97.7 F (36.5 C)     Temp Source 03/31/19 1908 Oral     SpO2 03/31/19 2003 94 %     Weight 03/31/19 1910 145 lb 1 oz (65.8 kg)     Height 03/31/19 1910 5\' 2"  (1.575 m)     Head Circumference --      Peak Flow --      Pain Score 03/31/19 1909 0     Pain Loc --      Pain Edu? --      Excl. in [place]? --     Vital signs reviewed, nursing assessments reviewed.   Constitutional:   Alert and oriented. Non-toxic appearance. Eyes:   Conjunctivae are normal.  EOMI. PERRL. ENT      Head:   Normocephalic and atraumatic.      Nose:   No congestion/rhinnorhea.       Mouth/Throat:   MMM, no pharyngeal erythema. No peritonsillar mass.       Neck:   No meningismus. Full ROM. Hematological/Lymphatic/Immunilogical:   No cervical lymphadenopathy. Cardiovascular:   RRR. Symmetric bilateral radial and DP pulses.  No murmurs. Cap refill less than 2 seconds. Respiratory:   Normal respiratory effort without tachypnea/retractions. Breath sounds are clear and equal bilaterally. No wheezes/rales/rhonchi. Gastrointestinal:   Soft and nontender. Non distended. There is no CVA tenderness.  No rebound, rigidity, or guarding.  Musculoskeletal:   Normal range of motion in all extremities. No joint effusions.  Tenderness primarily in the right mid femur.  Pain worsened by passive hip flexion of the  right leg.Marland Kitchen  No midline spinal tenderness Neurologic:   Normal speech and language.  Motor grossly intact. No acute focal neurologic deficits are appreciated.  Skin:    Skin is warm, dry and intact. No rash noted.  No petechiae, purpura, or bullae.  ____________________________________________    LABS (pertinent positives/negatives) (all labs ordered are listed, but only abnormal results are displayed) Labs Reviewed - No data to display ____________________________________________   EKG    ____________________________________________    RADIOLOGY  Dg Chest 1 View  Result Date: 03/31/2019 CLINICAL DATA:  85 year old female with a fall EXAM: CHEST  1 VIEW COMPARISON:  03/24/2010 FINDINGS: Cardiomediastinal silhouette slightly enlarged from the prior, though potentially secondary to lower lung volumes. Interlobular septal thickening. No pneumothorax or pleural effusion. No confluent airspace disease. No displaced fracture. IMPRESSION: Coarsened interstitial markings may reflect chronic changes, though early pulmonary edema not excluded. Electronically Signed   By: Corrie Mckusick D.O.   On: 03/31/2019 19:52   Dg Pelvis 1-2 Views  Result Date: 03/31/2019 CLINICAL DATA:  Recent fall with hip pain, initial encounter EXAM: PELVIS - 1-2 VIEW COMPARISON:  None. FINDINGS: Pelvic ring is intact. Subcapital femoral neck fracture is noted with impaction and angulation at the fracture site. No other focal abnormality is noted. IMPRESSION: Subcapital femoral neck fracture with impaction. Electronically Signed   By: Inez Catalina M.D.   On: 03/31/2019 19:51   Dg Femur Min 2 Views Right  Result Date: 03/31/2019 CLINICAL DATA:  85 year old female with fall EXAM: RIGHT FEMUR 2 VIEWS COMPARISON:  None. FINDINGS: Nondisplaced subcapital fracture of the right hip. Osteopenia.  Atherosclerosis. IMPRESSION: Nondisplaced subcapital right hip fracture. Electronically Signed   By: Corrie Mckusick D.O.   On:  03/31/2019 19:51    ____________________________________________   PROCEDURES Procedures  ____________________________________________    CLINICAL IMPRESSION / ASSESSMENT AND PLAN / ED COURSE  Medications ordered in the ED: Medications - No data to display  Pertinent labs & imaging results that were available during my care of the patient were reviewed by me and considered in my medical decision making (see chart for details).  AARIYA FERRICK was evaluated in Emergency Department on 03/31/2019 for the symptoms described in the history of present illness. She was evaluated in the context of the global COVID-19 pandemic, which necessitated consideration that the patient might be at risk for infection with the SARS-CoV-2 virus that causes COVID-19. Institutional protocols and algorithms that pertain to the evaluation of patients at risk for COVID-19 are in a state of rapid change based on information released by regulatory bodies including the CDC and federal and state organizations. These policies and  algorithms were followed during the patient's care in the ED.   Patient presents with right hip pain after a mechanical fall.  No evidence of head or C-spine trauma.  Vital signs and physical exam are entirely unremarkable except for some proximal right leg pain and tenderness.  Neurovascular intact.  X-rays show a impacted subcapital femur fracture on the right hip.  Discussed with patient who is agreeable to hospitalization and surgical management.  Discussed with orthopedics Dr. Harlow Mares who will evaluate the patient for surgical repair.  Plan for n.p.o. after midnight, admit to hospitalist for medical optimization.      ____________________________________________   FINAL CLINICAL IMPRESSION(S) / ED DIAGNOSES    Final diagnoses:  Subcapital fracture of femur, left, closed, initial encounter [facility])     ED Discharge Orders    None      Portions of this note were generated with  dragon dictation software. Dictation errors may occur despite best attempts at proofreading.   Carrie Mew, MD 03/31/19 2019

## 2019-03-31 NOTE — H&P (Signed)
[place] [place] NAME: Brittany Blackwell    MR#:  [id number]  DATE OF BIRTH:  July 31, 1918  DATE OF ADMISSION:  03/31/2019  PRIMARY CARE PHYSICIAN: Maryland Pink, MD   REQUESTING/REFERRING PHYSICIAN: Joni Fears, MD  CHIEF COMPLAINT:   Chief Complaint  Patient presents with  . Fall    HISTORY OF PRESENT ILLNESS:  Brittany Blackwell  is a 85 y.o. female who presents with chief complaint as above.  Patient had mechanical fall at home this evening with subsequent right hip pain.  Here in the ED she was found on imaging to have a right hip fracture.  Orthopedic surgery was contacted and hospitalist were called for admission  PAST MEDICAL HISTORY:   Past Medical History:  Diagnosis Date  . Acid reflux   . Hypertension   . Thyroid disease      PAST SURGICAL HISTORY:   Past Surgical History:  Procedure Laterality Date  . ABDOMINAL HYSTERECTOMY    . TONSILLECTOMY       SOCIAL HISTORY:   Social History   Tobacco Use  . Smoking status: Never Smoker  . Smokeless tobacco: Never Used  Substance Use Topics  . Alcohol use: No     FAMILY HISTORY:   Family History  Problem Relation Age of Onset  . Breast cancer Mother   . Heart attack Father      DRUG ALLERGIES:   Allergies  Allergen Reactions  . Demerol [Meperidine] Other (See Comments)    BP drops    MEDICATIONS AT HOME:   Prior to Admission medications   Medication Sig Start Date End Date Taking? Authorizing Provider  ALPRAZolam Duanne Moron) 0.25 MG tablet Take 1 tablet by mouth daily as needed. 01/20/19  Yes [provider]  budesonide (ENTOCORT EC) 3 MG 24 hr capsule Take 3 capsules by mouth daily. 02/12/19  Yes [provider]  COMBIGAN 0.2-0.5 % ophthalmic solution Place 1 drop into both eyes every 12 (twelve) hours.  03/22/19  Yes [provider]  furosemide (LASIX) 20 MG tablet Take 20 mg by mouth daily.   Yes [provider]   latanoprost (XALATAN) 0.005 % ophthalmic solution Place 1 drop into both eyes at bedtime.  03/22/19  Yes [provider]  potassium chloride SA (K-DUR,KLOR-CON) 20 MEQ tablet Take 20 mEq by mouth 2 (two) times daily.  02/21/14  Yes [provider]  thyroid (ARMOUR THYROID) 90 MG tablet Take 90 mg by mouth daily.   Yes [provider]  timolol (TIMOPTIC) 0.5 % ophthalmic solution Place 1 drop into both eyes daily.  03/22/19  Yes [provider]  amLODipine (NORVASC) 5 MG tablet Take 5 mg by mouth daily.    [provider]  atenolol (TENORMIN) 50 MG tablet Take 50 mg by mouth daily.    [provider]    REVIEW OF SYSTEMS:  Review of Systems  Constitutional: Negative for chills, fever, malaise/fatigue and weight loss.  HENT: Negative for ear pain, hearing loss and tinnitus.   Eyes: Negative for blurred vision, double vision, pain and redness.  Respiratory: Negative for cough, hemoptysis and shortness of breath.   Cardiovascular: Negative for chest pain, palpitations, orthopnea and leg swelling.  Gastrointestinal: Negative for abdominal pain, constipation, diarrhea, nausea and vomiting.  Genitourinary: Negative for dysuria, frequency and hematuria.  Musculoskeletal: Positive for joint pain (Right hip). Negative for back pain and neck pain.  Skin:       No acne,  rash, or lesions  Neurological: Negative for dizziness, tremors, focal weakness and weakness.  Endo/Heme/Allergies: Negative for polydipsia. Does not bruise/bleed easily.  Psychiatric/Behavioral: Negative for depression. The patient is not nervous/anxious and does not have insomnia.      VITAL SIGNS:   Vitals:   03/31/19 1908 03/31/19 1910 03/31/19 2003 03/31/19 2030  BP:   (!) 159/106 (!) 176/75  Pulse:   89 86  Resp:   18   Temp: 97.7 F (36.5 C)     TempSrc: Oral     SpO2:   94% 95%  Weight:  65.8 kg    Height:  5\' 2"  (1.575 m)     Wt Readings from Last 3 Encounters:   03/31/19 65.8 kg  02/07/14 65.8 kg  11/08/13 64.9 kg    PHYSICAL EXAMINATION:  Physical Exam  Vitals reviewed. Constitutional: She is oriented to person, place, and time. She appears well-developed and well-nourished. No distress.  HENT:  Head: Normocephalic and atraumatic.  Mouth/Throat: Oropharynx is clear and moist.  Eyes: Pupils are equal, round, and reactive to light. Conjunctivae and EOM are normal. No scleral icterus.  Neck: Normal range of motion. Neck supple. No JVD present. No thyromegaly present.  Cardiovascular: Normal rate, regular rhythm and intact distal pulses. Exam reveals no gallop and no friction rub.  No murmur heard. Respiratory: Effort normal and breath sounds normal. No respiratory distress. She has no wheezes. She has no rales.  GI: Soft. Bowel sounds are normal. She exhibits no distension. There is no abdominal tenderness.  Musculoskeletal: Normal range of motion.        General: Tenderness (Right hip) present. No edema.     Comments: No arthritis, no gout  Lymphadenopathy:    She has no cervical adenopathy.  Neurological: She is alert and oriented to person, place, and time. No cranial nerve deficit.  No dysarthria, no aphasia  Skin: Skin is warm and dry. No rash noted. No erythema.  Psychiatric: She has a normal mood and affect. Her behavior is normal. Judgment and thought content normal.    LABORATORY PANEL:   CBC No results for input(s): WBC, HGB, HCT, PLT in the last 168 hours. ------------------------------------------------------------------------------------------------------------------  Chemistries  No results for input(s): NA, K, CL, CO2, GLUCOSE, BUN, CREATININE, CALCIUM, MG, AST, ALT, ALKPHOS, BILITOT in the last 168 hours.  Invalid input(s): GFRCGP ------------------------------------------------------------------------------------------------------------------  Cardiac Enzymes No results for input(s): TROPONINI in the last 168  hours. ------------------------------------------------------------------------------------------------------------------  RADIOLOGY:  Dg Chest 1 View  Result Date: 03/31/2019 CLINICAL DATA:  85 year old female with a fall EXAM: CHEST  1 VIEW COMPARISON:  03/24/2010 FINDINGS: Cardiomediastinal silhouette slightly enlarged from the prior, though potentially secondary to lower lung volumes. Interlobular septal thickening. No pneumothorax or pleural effusion. No confluent airspace disease. No displaced fracture. IMPRESSION: Coarsened interstitial markings may reflect chronic changes, though early pulmonary edema not excluded. Electronically Signed   By: Corrie Mckusick D.O.   On: 03/31/2019 19:52   Dg Pelvis 1-2 Views  Result Date: 03/31/2019 CLINICAL DATA:  Recent fall with hip pain, initial encounter EXAM: PELVIS - 1-2 VIEW COMPARISON:  None. FINDINGS: Pelvic ring is intact. Subcapital femoral neck fracture is noted with impaction and angulation at the fracture site. No other focal abnormality is noted. IMPRESSION: Subcapital femoral neck fracture with impaction. Electronically Signed   By: Inez Catalina M.D.   On: 03/31/2019 19:51   Dg Femur Min 2 Views Right  Result Date: 03/31/2019 CLINICAL DATA:  85 year old female with fall EXAM:  RIGHT FEMUR 2 VIEWS COMPARISON:  None. FINDINGS: Nondisplaced subcapital fracture of the right hip. Osteopenia.  Atherosclerosis. IMPRESSION: Nondisplaced subcapital right hip fracture. Electronically Signed   By: Corrie Mckusick D.O.   On: 03/31/2019 19:51    EKG:  No orders found for this or any previous visit.  IMPRESSION AND PLAN:  Principal Problem:   Closed right hip fracture [facility]) -orthopedic surgery consulted, plan for operative repair. Active Problems:   BP (high blood pressure) -home dose antihypertensives   Degeneration macular -home dose eyedrops   Disease of thyroid gland -home dose thyroid replacement  Chart review performed and case discussed with  ED provider. Labs, imaging and/or ECG reviewed by provider and discussed with patient/family. Management plans discussed with the patient and/or family.  DVT PROPHYLAXIS: Mechanical only  GI PROPHYLAXIS:  None  ADMISSION STATUS: Inpatient     CODE STATUS: Full  TOTAL TIME TAKING CARE OF THIS PATIENT: 45 minutes.   Ethlyn Daniels 03/31/2019, 9:57 PM  [facility]  Office  [phone]  CC: Primary care physician; [place] Pink, MD  Note:  This document was prepared using Dragon voice recognition software and may include unintentional dictation errors.

## 2019-03-31 NOTE — ED Notes (Signed)
Report given to floor

## 2019-03-31 NOTE — ED Notes (Signed)
ED TO INPATIENT HANDOFF REPORT  ED Nurse Name and Phone #: [phone]  S Name/Age/Gender Brittany Blackwell 85 y.o. female Room/Bed: ED05A/ED05A  Code Status   Code Status: Not on file  Home/SNF/Other Home Patient oriented to: situation Is this baseline? Yes   Triage Complete: Triage complete  Chief Complaint Fall  Triage Note Pt presents from home via acems with c/o fall. unknown time of fall, but family of pt estimates around 3pm is when the fall occured. Pt states she tripped over shoes and fell in kitchen. pt hit life alert button and was unable to get off floor independently. Pt denies loc or blood thinner use. Skin tear to right elbow from fall. 20G in left forearm placed by ems. Pt also c/o right leg pain.    Allergies Allergies  Allergen Reactions  . Demerol [Meperidine] Other (See Comments)    BP drops    Level of Care/Admitting Diagnosis ED Disposition    ED Disposition Condition [facility] Area: [place] [[id number]]  Level of Care: Med-Surg [16]  Diagnosis: Closed right hip fracture [facility]) [[id number]]  Admitting Physician: Lance Coon [[id number]]  Attending Physician: Lance Coon [phone]  Estimated length of stay: past midnight tomorrow  Certification:: I certify this patient will need inpatient services for at least 2 midnights  Possible Covid Disease Patient Isolation: N/A  PT Class (Do Not Modify): Inpatient [101]  PT Acc Code (Do Not Modify): Private [1]       B Medical/Surgery History Past Medical History:  Diagnosis Date  . Acid reflux   . Hypertension   . Thyroid disease    Past Surgical History:  Procedure Laterality Date  . ABDOMINAL HYSTERECTOMY    . TONSILLECTOMY       A IV Location/Drains/Wounds Patient Lines/Drains/Airways Status   Active Line/Drains/Airways    None          Intake/Output Last 24 hours No intake or output data in the 24 hours ending 03/31/19 2214  Labs/Imaging No  results found for this or any previous visit (from the past 48 hour(s)). Dg Chest 1 View  Result Date: 03/31/2019 CLINICAL DATA:  85 year old female with a fall EXAM: CHEST  1 VIEW COMPARISON:  03/24/2010 FINDINGS: Cardiomediastinal silhouette slightly enlarged from the prior, though potentially secondary to lower lung volumes. Interlobular septal thickening. No pneumothorax or pleural effusion. No confluent airspace disease. No displaced fracture. IMPRESSION: Coarsened interstitial markings may reflect chronic changes, though early pulmonary edema not excluded. Electronically Signed   By: Corrie Mckusick D.O.   On: 03/31/2019 19:52   Dg Pelvis 1-2 Views  Result Date: 03/31/2019 CLINICAL DATA:  Recent fall with hip pain, initial encounter EXAM: PELVIS - 1-2 VIEW COMPARISON:  None. FINDINGS: Pelvic ring is intact. Subcapital femoral neck fracture is noted with impaction and angulation at the fracture site. No other focal abnormality is noted. IMPRESSION: Subcapital femoral neck fracture with impaction. Electronically Signed   By: Inez Catalina M.D.   On: 03/31/2019 19:51   Dg Femur Min 2 Views Right  Result Date: 03/31/2019 CLINICAL DATA:  85 year old female with fall EXAM: RIGHT FEMUR 2 VIEWS COMPARISON:  None. FINDINGS: Nondisplaced subcapital fracture of the right hip. Osteopenia.  Atherosclerosis. IMPRESSION: Nondisplaced subcapital right hip fracture. Electronically Signed   By: Corrie Mckusick D.O.   On: 03/31/2019 19:51    Pending Labs [employer] (From admission, onward)    Start     Ordered   Signed and  Held  Basic metabolic panel  Tomorrow morning,   R     Signed and Held   Signed and Held  CBC  Tomorrow morning,   R     Signed and Held          Vitals/Pain Today's Vitals   03/31/19 1910 03/31/19 2003 03/31/19 2030 03/31/19 2130  BP:  (!) 159/106 (!) 176/75 (!) 174/68  Pulse:  89 86 87  Resp:  18    Temp:      TempSrc:      SpO2:  94% 95% 96%  Weight: 65.8 kg     Height:  5\' 2"  (1.575 m)     PainSc:        Isolation Precautions No active isolations  Medications Medications - No data to display  Mobility walks with device High fall risk   Focused Assessments    R Recommendations: See Admitting Provider Note  Report given to:   Additional Notes:

## 2019-03-31 NOTE — ED Triage Notes (Signed)
Pt presents from home via acems with c/o fall. unknown time of fall, but family of pt estimates around 3pm is when the fall occured. Pt states she tripped over shoes and fell in kitchen. pt hit life alert button and was unable to get off floor independently. Pt denies loc or blood thinner use. Skin tear to right elbow from fall. 20G in left forearm placed by ems. Pt also c/o right leg pain.

## 2019-04-01 ENCOUNTER — Inpatient Hospital Stay: Payer: PPO

## 2019-04-01 ENCOUNTER — Other Ambulatory Visit: Payer: Self-pay

## 2019-04-01 ENCOUNTER — Inpatient Hospital Stay: Payer: PPO | Admitting: Anesthesiology

## 2019-04-01 ENCOUNTER — Encounter
Admission: EM | Disposition: A | Discharge status: Skilled Nursing Facility | Payer: Self-pay | Source: Home / Self Care | Attending: Internal Medicine

## 2019-04-01 HISTORY — PX: HIP PINNING,CANNULATED: SHX1758

## 2019-04-01 LAB — BASIC METABOLIC PANEL
Anion gap: 11 (ref 5–15)
BUN: 37 mg/dL — ABNORMAL HIGH (ref 8–23)
CO2: 22 mmol/L (ref 22–32)
Calcium: 8.8 mg/dL — ABNORMAL LOW (ref 8.9–10.3)
Chloride: 105 mmol/L (ref 98–111)
Creatinine, Ser: 0.91 mg/dL (ref 0.44–1.00)
GFR calc Af Amer: >60 mL/min (ref >60)
GFR calc non Af Amer: 52 mL/min — ABNORMAL LOW (ref >60)
Glucose, Bld: 110 mg/dL — ABNORMAL HIGH (ref 70–99)
Potassium: 3.8 mmol/L (ref 3.5–5.1)
Sodium: 138 mmol/L (ref 135–145)

## 2019-04-01 LAB — CBC
HCT: 33.6 % — ABNORMAL LOW (ref 36.0–46.0)
Hemoglobin: 11.1 g/dL — ABNORMAL LOW (ref 12.0–15.0)
MCH: 31.4 pg (ref 26.0–34.0)
MCHC: 33 g/dL (ref 30.0–36.0)
MCV: 95.2 fL (ref 80.0–100.0)
Platelets: 142 10*3/uL — ABNORMAL LOW (ref 150–400)
RBC: 3.53 MIL/uL — ABNORMAL LOW (ref 3.87–5.11)
RDW: 13.1 % (ref 11.5–15.5)
WBC: 9.7 10*3/uL (ref 4.0–10.5)
nRBC: 0 % (ref 0.0–0.2)

## 2019-04-01 LAB — PROTIME-INR
INR: 1.1 (ref 0.8–1.2)
Prothrombin Time: 14.2 seconds (ref 11.4–15.2)

## 2019-04-01 LAB — SURGICAL PCR SCREEN
MRSA, PCR: NEGATIVE
Staphylococcus aureus: NEGATIVE

## 2019-04-01 SURGERY — FIXATION, FEMUR, NECK, PERCUTANEOUS, USING SCREW
Anesthesia: Spinal | Site: Hip | Laterality: Right

## 2019-04-01 MED ORDER — TIMOLOL MALEATE 0.5 % OP SOLN
1.0000 [drp] | Freq: Two times a day (BID) | OPHTHALMIC | Status: DC
  Administered 2019-04-01 – 2019-04-05 (×10): 1 [drp] via OPHTHALMIC
  Filled 2019-04-01: qty 5

## 2019-04-01 MED ORDER — BUPIVACAINE IN DEXTROSE 0.75-8.25 % IT SOLN: INTRATHECAL | Status: DC | PRN

## 2019-04-01 MED ORDER — ONDANSETRON HCL 4 MG/2ML IJ SOLN: 4.0000 mg | Freq: Four times a day (QID) | INTRAMUSCULAR | Status: DC | PRN

## 2019-04-01 MED ORDER — FENTANYL CITRATE (PF) 100 MCG/2ML IJ SOLN: 25.0000 ug | INTRAMUSCULAR | Status: DC | PRN

## 2019-04-01 MED ORDER — METOCLOPRAMIDE HCL 5 MG/ML IJ SOLN: 5.0000 mg | Freq: Three times a day (TID) | INTRAMUSCULAR | Status: DC | PRN

## 2019-04-01 MED ORDER — BUPIVACAINE HCL (PF) 0.5 % IJ SOLN
INTRAMUSCULAR | Status: AC
  Filled 2019-04-01: qty 10

## 2019-04-01 MED ORDER — BRIMONIDINE TARTRATE 0.2 % OP SOLN
1.0000 [drp] | Freq: Two times a day (BID) | OPHTHALMIC | Status: DC
  Administered 2019-04-01 – 2019-04-05 (×10): 1 [drp] via OPHTHALMIC
  Filled 2019-04-01: qty 5

## 2019-04-01 MED ORDER — CEFAZOLIN SODIUM-DEXTROSE 1-4 GM/50ML-% IV SOLN
1.0000 g | Freq: Four times a day (QID) | INTRAVENOUS | Status: AC
  Administered 2019-04-01 – 2019-04-02 (×3): 1 g via INTRAVENOUS
  Filled 2019-04-01 (×3): qty 50

## 2019-04-01 MED ORDER — SODIUM CHLORIDE 0.9 % IR SOLN
Status: DC | PRN
  Administered 2019-04-01: 13:00:00 1000 mL

## 2019-04-01 MED ORDER — METOCLOPRAMIDE HCL 5 MG PO TABS: 5.0000 mg | ORAL_TABLET | Freq: Three times a day (TID) | ORAL | Status: DC | PRN

## 2019-04-01 MED ORDER — FENTANYL CITRATE (PF) 100 MCG/2ML IJ SOLN
INTRAMUSCULAR | Status: AC
  Filled 2019-04-01: qty 2

## 2019-04-01 MED ORDER — PROPOFOL 10 MG/ML IV BOLUS
INTRAVENOUS | Status: AC
  Filled 2019-04-01: qty 20

## 2019-04-01 MED ORDER — PROPOFOL 500 MG/50ML IV EMUL
INTRAVENOUS | Status: DC | PRN
  Administered 2019-04-01: 25 ug/kg/min via INTRAVENOUS

## 2019-04-01 MED ORDER — DOCUSATE SODIUM 100 MG PO CAPS
100.0000 mg | ORAL_CAPSULE | Freq: Two times a day (BID) | ORAL | Status: DC
  Administered 2019-04-01 – 2019-04-04 (×7): 100 mg via ORAL
  Filled 2019-04-01 (×7): qty 1

## 2019-04-01 MED ORDER — FENTANYL CITRATE (PF) 100 MCG/2ML IJ SOLN
INTRAMUSCULAR | Status: DC | PRN
  Administered 2019-04-01: 25 ug via INTRAVENOUS

## 2019-04-01 MED ORDER — LACTATED RINGERS IV SOLN
INTRAVENOUS | Status: DC
  Administered 2019-04-01: 16:00:00 via INTRAVENOUS

## 2019-04-01 MED ORDER — ONDANSETRON HCL 4 MG PO TABS: 4.0000 mg | ORAL_TABLET | Freq: Four times a day (QID) | ORAL | Status: DC | PRN

## 2019-04-01 MED ORDER — SODIUM CHLORIDE FLUSH 0.9 % IV SOLN
INTRAVENOUS | Status: AC
  Filled 2019-04-01: qty 10

## 2019-04-01 MED ORDER — LACTATED RINGERS IV SOLN
INTRAVENOUS | Status: DC | PRN
  Administered 2019-04-01: 12:00:00 via INTRAVENOUS

## 2019-04-01 MED ORDER — BACITRACIN 50000 UNITS IM SOLR
INTRAMUSCULAR | Status: AC
  Filled 2019-04-01: qty 1

## 2019-04-01 MED ORDER — ENOXAPARIN SODIUM 30 MG/0.3ML ~~LOC~~ SOLN
30.0000 mg | SUBCUTANEOUS | Status: DC
  Administered 2019-04-02 – 2019-04-05 (×4): 30 mg via SUBCUTANEOUS
  Filled 2019-04-01 (×4): qty 0.3

## 2019-04-01 MED ORDER — KETAMINE HCL 50 MG/ML IJ SOLN
INTRAMUSCULAR | Status: AC
  Filled 2019-04-01: qty 10

## 2019-04-01 MED ORDER — KETAMINE HCL 100 MG/ML IJ SOLN
INTRAMUSCULAR | Status: DC | PRN
  Administered 2019-04-01: 25 mg via INTRAVENOUS
  Administered 2019-04-01: 5 mg via INTRAVENOUS
  Administered 2019-04-01: 10 mg via INTRAVENOUS

## 2019-04-01 MED ORDER — CEFAZOLIN SODIUM-DEXTROSE 2-4 GM/100ML-% IV SOLN
2.0000 g | INTRAVENOUS | Status: AC
  Administered 2019-04-01: 2 g via INTRAVENOUS
  Filled 2019-04-01: qty 100

## 2019-04-01 MED ORDER — BUPIVACAINE HCL (PF) 0.5 % IJ SOLN
INTRAMUSCULAR | Status: DC | PRN
  Administered 2019-04-01: 3 mL

## 2019-04-01 SURGICAL SUPPLY — 36 items
BIT DRILL CANN LRG QC 5X300 (BIT) ×2 IMPLANT
BLADE SURG SZ10 CARB STEEL (BLADE) ×3 IMPLANT
BNDG COHESIVE 6X5 TAN STRL LF (GAUZE/BANDAGES/DRESSINGS) ×6 IMPLANT
BRUSH SCRUB EZ  4% CHG (MISCELLANEOUS) ×2
BRUSH SCRUB EZ 4% CHG (MISCELLANEOUS) ×2 IMPLANT
CANISTER SUCT 1200ML W/VALVE (MISCELLANEOUS) ×3 IMPLANT
CHLORAPREP W/TINT 26 (MISCELLANEOUS) ×3 IMPLANT
COVER WAND RF STERILE (DRAPES) ×3 IMPLANT
DRAPE U-SHAPE 47X51 STRL (DRAPES) ×2 IMPLANT
DRESSING ALLEVYN LIFE SACRUM (GAUZE/BANDAGES/DRESSINGS) ×2 IMPLANT
DRSG OPSITE POSTOP 4X8 (GAUZE/BANDAGES/DRESSINGS) ×2 IMPLANT
ELECT REM PT RETURN 9FT ADLT (ELECTROSURGICAL) ×3
ELECTRODE REM PT RTRN 9FT ADLT (ELECTROSURGICAL) ×1 IMPLANT
GAUZE SPONGE 4X4 12PLY STRL (GAUZE/BANDAGES/DRESSINGS) ×3 IMPLANT
GAUZE XEROFORM 1X8 LF (GAUZE/BANDAGES/DRESSINGS) ×3 IMPLANT
GLOVE INDICATOR 8.0 STRL GRN (GLOVE) ×7 IMPLANT
GLOVE SURG ORTHO 8.0 STRL STRW (GLOVE) ×6 IMPLANT
GOWN STRL REUS W/ TWL LRG LVL3 (GOWN DISPOSABLE) ×1 IMPLANT
GOWN STRL REUS W/ TWL XL LVL3 (GOWN DISPOSABLE) ×1 IMPLANT
GOWN STRL REUS W/TWL LRG LVL3 (GOWN DISPOSABLE) ×4
GOWN STRL REUS W/TWL XL LVL3 (GOWN DISPOSABLE) ×4
GUIDEWIRE THREADED 2.8MM (WIRE) ×6 IMPLANT
HOLDER FOLEY CATH W/STRAP (MISCELLANEOUS) IMPLANT
KIT TURNOVER CYSTO (KITS) ×3 IMPLANT
MAT ABSORB  FLUID 56X50 GRAY (MISCELLANEOUS) ×2
MAT ABSORB FLUID 56X50 GRAY (MISCELLANEOUS) ×1 IMPLANT
NS IRRIG 1000ML POUR BTL (IV SOLUTION) ×3 IMPLANT
PACK HIP COMPR (MISCELLANEOUS) ×3 IMPLANT
SCREW 6.5MM CANN 32X85 (Screw) ×2 IMPLANT
SCREW CANN 32MM 6.5X75MM (Screw) ×4 IMPLANT
STAPLER SKIN PROX 35W (STAPLE) ×3 IMPLANT
SUT VIC AB 0 CT1 36 (SUTURE) ×2 IMPLANT
SUT VIC AB 2-0 CT2 27 (SUTURE) ×3 IMPLANT
TRAY FOLEY MTR SLVR 16FR STAT (SET/KITS/TRAYS/PACK) IMPLANT
WASHER FOR 5.0 SCREWS (Washer) ×2 IMPLANT
WATER STERILE IRR 1000ML POUR (IV SOLUTION) ×1 IMPLANT

## 2019-04-01 NOTE — Op Note (Signed)
04/01/2019  1:51 PM  PATIENT:  [name]    PRE-OPERATIVE DIAGNOSIS:  Right hip fracture  POST-OPERATIVE DIAGNOSIS:  Same  PROCEDURE:  CANNULATED HIP PINNING, RIGHT  SURGEON:  Lovell Sheehan, MD     ANESTHESIA:   General  PREOPERATIVE INDICATIONS:  Brittany Blackwell is a  85 y.o. female who fell and was found to have a diagnosis of Right hip fracture who elected for surgical management.    The risks benefits and alternatives were discussed with the patient preoperatively including but not limited to the risks of infection, bleeding, nerve injury, cardiopulmonary complications, blood clots, malunion, nonunion, avascular necrosis, the need for revision surgery, the potential for conversion to hemiarthroplasty, among others, and the patient was willing to proceed.  OPERATIVE IMPLANTS: 6.5 mm cannulated screws x3  OPERATIVE FINDINGS: Clinical osteoporosis with weak bone, proximal femur  OPERATIVE PROCEDURE: The patient was brought to the operating room and placed in supine position. IV antibiotics were given. General anesthesia administered.  The patient was placed on the fracture table. The operative extremity was positioned, without any significant reduction maneuver and was prepped and draped in usual sterile fashion.  Time out was performed.  Small incision was made distal to the greater trochanter, and 3 guidewires were introduced into the head and neck. The lengths were measured. The reduction was slightly valgus, and near-anatomic. I opened the cortex with a cannulated drill, and then placed the screws into position. Satisfactory fixation was achieved.  The wounds were irrigated copiously, and repaired with 2-0 Vicryl and staples for the skin. A sterile dressing was applied. Sponge and needle count were correct.  There no apparent complications and the patient tolerated the procedure well.  Elyn Aquas. Harlow Mares, MD

## 2019-04-01 NOTE — Anesthesia Preprocedure Evaluation (Addendum)
Anesthesia Evaluation  Patient identified by MRN, date of birth, ID band Patient awake    Reviewed: Allergy & Precautions, H&P , NPO status , Patient's Chart, lab work & pertinent test results, reviewed documented beta blocker date and time   History of Anesthesia Complications Negative for: history of anesthetic complications  Airway Mallampati: II   Neck ROM: full    Dental  (+) Poor Dentition, Dental Advidsory Given   Pulmonary neg pulmonary ROS,    Pulmonary exam normal        Cardiovascular Exercise Tolerance: Poor hypertension, On Medications (-) angina(-) CAD, (-) Past MI, (-) Cardiac Stents and (-) CABG Normal cardiovascular exam(-) dysrhythmias (-) Valvular Problems/Murmurs Rhythm:regular Rate:Normal     Neuro/Psych negative neurological ROS  negative psych ROS   GI/Hepatic Neg liver ROS, GERD  Medicated,  Endo/Other  negative endocrine ROS  Renal/GU negative Renal ROS  negative genitourinary   Musculoskeletal   Abdominal   Peds  Hematology negative hematology ROS (+)   Anesthesia Other Findings Past Medical History: No date: Acid reflux No date: Hypertension No date: Thyroid disease Past Surgical History: No date: ABDOMINAL HYSTERECTOMY No date: TONSILLECTOMY BMI    Body Mass Index:  26.53 kg/m     Reproductive/Obstetrics negative OB ROS                            Anesthesia Physical Anesthesia Plan  ASA: III  Anesthesia Plan: Spinal   Post-op Pain Management:    Induction:   PONV Risk Score and Plan: Propofol infusion and TIVA  Airway Management Planned:   Additional Equipment:   Intra-op Plan:   Post-operative Plan:   Informed Consent: I have reviewed the patients History and Physical, chart, labs and discussed the procedure including the risks, benefits and alternatives for the proposed anesthesia with the patient or authorized representative who has  indicated his/her understanding and acceptance.     Dental Advisory Given  Plan Discussed with: CRNA  Anesthesia Plan Comments:        Anesthesia Quick Evaluation

## 2019-04-01 NOTE — Anesthesia Post-op Follow-up Note (Signed)
Anesthesia QCDR form completed.        

## 2019-04-01 NOTE — Progress Notes (Signed)
Pt not bladder scanned in pacu, Spoke with Dr Wende Crease and spoke with Britt Bottom who stated pt has had large amount of urinary incontinence.on floor .

## 2019-04-01 NOTE — Progress Notes (Signed)
Patient ID: Brittany Blackwell, female   DOB: 01-12-1918, 85 y.o.   MRN: [id number]  [facility] PROGRESS NOTE  Brittany Blackwell OIN:[id number] DOB: 10-Nov-1918 DOA: 03/31/2019 PCP: [place] Pink, MD  HPI/Subjective: Patient had a fall yesterday.  She complains of pain in the right leg.  She states her neck is sore but she is able to move it around pretty good.  Objective: Vitals:   03/31/19 2347 04/01/19 0446  BP: (!) 178/83 (!) 148/75  Pulse: 96 94  Resp: 20 17  Temp: 98.4 F (36.9 C) 98 F (36.7 C)  SpO2: 93% 95%    Filed Weights   03/31/19 1910  Weight: 65.8 kg    ROS: Review of Systems  Constitutional: Negative for chills and fever.  Eyes: Negative for blurred vision.  Respiratory: Negative for cough and shortness of breath.   Cardiovascular: Negative for chest pain.  Gastrointestinal: Negative for abdominal pain, constipation, diarrhea, nausea and vomiting.  Genitourinary: Negative for dysuria.  Musculoskeletal: Positive for joint pain.  Neurological: Negative for dizziness and headaches.   Exam: Physical Exam  Constitutional: She is oriented to person, place, and time.  HENT:  Nose: No mucosal edema.  Mouth/Throat: No oropharyngeal exudate or posterior oropharyngeal edema.  Eyes: Pupils are equal, round, and reactive to light. Conjunctivae, EOM and lids are normal.  Neck: No JVD present. Carotid bruit is not present. No edema present. No thyroid mass and no thyromegaly present.  Patient able to move her neck without a problem.  Cardiovascular: S1 normal and S2 normal. Exam reveals no gallop.  No murmur heard. Pulses:      Dorsalis pedis pulses are 2+ on the right side and 2+ on the left side.  Respiratory: No respiratory distress. She has no wheezes. She has no rhonchi. She has no rales.  GI: Soft. Bowel sounds are normal. There is no abdominal tenderness.  Musculoskeletal:     Right ankle: She exhibits no swelling.     Left ankle: She exhibits no swelling.   Lymphadenopathy:    She has no cervical adenopathy.  Neurological: She is alert and oriented to person, place, and time. No cranial nerve deficit.  Skin: Skin is warm. No rash noted. Nails show no clubbing.  Psychiatric: She has a normal mood and affect.      Data Reviewed: Basic Metabolic Panel: Recent Labs  Lab 04/01/19 0323  NA 138  K 3.8  CL 105  CO2 22  GLUCOSE 110*  BUN 37*  CREATININE 0.91  CALCIUM 8.8*   CBC: Recent Labs  Lab 04/01/19 0323  WBC 9.7  HGB 11.1*  HCT 33.6*  MCV 95.2  PLT 142*     Studies: Dg Chest 1 View  Result Date: 03/31/2019 CLINICAL DATA:  85 year old female with a fall EXAM: CHEST  1 VIEW COMPARISON:  03/24/2010 FINDINGS: Cardiomediastinal silhouette slightly enlarged from the prior, though potentially secondary to lower lung volumes. Interlobular septal thickening. No pneumothorax or pleural effusion. No confluent airspace disease. No displaced fracture. IMPRESSION: Coarsened interstitial markings may reflect chronic changes, though early pulmonary edema not excluded. Electronically Signed   By: Corrie Mckusick D.O.   On: 03/31/2019 19:52   Dg Pelvis 1-2 Views  Result Date: 03/31/2019 CLINICAL DATA:  Recent fall with hip pain, initial encounter EXAM: PELVIS - 1-2 VIEW COMPARISON:  None. FINDINGS: Pelvic ring is intact. Subcapital femoral neck fracture is noted with impaction and angulation at the fracture site. No other focal abnormality is noted. IMPRESSION: Subcapital  femoral neck fracture with impaction. Electronically Signed   By: Inez Catalina M.D.   On: 03/31/2019 19:51   Dg Femur Min 2 Views Right  Result Date: 03/31/2019 CLINICAL DATA:  85 year old female with fall EXAM: RIGHT FEMUR 2 VIEWS COMPARISON:  None. FINDINGS: Nondisplaced subcapital fracture of the right hip. Osteopenia.  Atherosclerosis. IMPRESSION: Nondisplaced subcapital right hip fracture. Electronically Signed   By: Corrie Mckusick D.O.   On: 03/31/2019 19:51     Scheduled Meds: . amLODipine  5 mg Oral Daily  . atenolol  50 mg Oral Daily  . brimonidine  1 drop Both Eyes BID   And  . timolol  1 drop Both Eyes BID  . budesonide  9 mg Oral Daily  . furosemide  20 mg Oral Daily  . latanoprost  1 drop Both Eyes QHS  . thyroid  90 mg Oral Daily   Continuous Infusions: . sodium chloride 75 mL/hr at 03/31/19 2345  .  ceFAZolin (ANCEF) IV      Assessment/Plan:  1. Preoperative consultation for right hip fracture closed, initial evaluation..  No contraindications to surgery at this time.  Decrease rate of IV fluids.  Surgery must be done weightbearing bones to lessen the mortality.   2. Hypertension.  Continue Norvasc and atenolol 3. Hypothyroidism unspecified on [place] Thyroid 4. Glaucoma unspecified continue eyedrops  Code Status:     Code Status Orders  (From admission, onward)         Start     Ordered   03/31/19 2255  Full code  Continuous     03/31/19 2255        Code Status History    This patient has a current code status but no historical code status.     Family Communication: Spoke with son on the phone Disposition Plan: To be determined  Consultants:  Orthopedic surgery  Time spent: 28 minutes  [place]

## 2019-04-01 NOTE — Anesthesia Procedure Notes (Signed)
Procedure Name: MAC Date/Time: 04/01/2019 12:45 PM Performed by: Allean Found, CRNA Pre-anesthesia Checklist: Patient identified, Emergency Drugs available, Suction available, Patient being monitored and Timeout performed Patient Re-evaluated:Patient Re-evaluated prior to induction Oxygen Delivery Method: Nasal cannula Placement Confirmation: positive ETCO2

## 2019-04-01 NOTE — H&P (Signed)
The patient has been re-examined, and the chart reviewed, and there have been no interval changes to the documented history and physical.  Plan a right hip closed reduction with percutaneous pinning today.  Anesthesia is consulted regarding a peripheral nerve block for post-operative pain.  The risks, benefits, and alternatives have been discussed at length, and the patient is willing to proceed.

## 2019-04-01 NOTE — Progress Notes (Signed)
Several attempts in the last 1hr were made to get in touch with the legal guardian to get consent for surgery without success. Pt has signed the consent.

## 2019-04-01 NOTE — Anesthesia Procedure Notes (Addendum)
Spinal  Patient location during procedure: OR Start time: 04/01/2019 12:30 PM End time: 04/01/2019 12:43 PM Staffing Anesthesiologist: Martha Clan, MD Resident/CRNA: Allean Found, CRNA Performed: anesthesiologist  Preanesthetic Checklist Completed: patient identified, site marked, surgical consent, pre-op evaluation, timeout performed, IV checked, risks and benefits discussed and monitors and equipment checked Spinal Block Patient position: sitting Prep: ChloraPrep Patient monitoring: heart rate, continuous pulse ox, blood pressure and cardiac monitor Approach: midline Location: L3-4 Injection technique: single-shot Needle Needle type: [place]  Needle gauge: 22 G Needle length: 9 cm Assessment Sensory level: T10 Additional Notes Negative paresthesia. Negative blood return. Positive free-flowing CSF. Expiration date of kit checked and confirmed. Patient tolerated procedure well, without complications.

## 2019-04-01 NOTE — Transfer of Care (Signed)
Immediate Anesthesia Transfer of Care Note  Patient: Brittany Blackwell  Procedure(s) Performed: CANNULATED HIP PINNING (Right Hip)  Patient Location: PACU  Anesthesia Type:Spinal  Level of Consciousness: sedated  Airway & Oxygen Therapy: Patient Spontanous Breathing and Patient connected to nasal cannula oxygen  Post-op Assessment: Report given to RN and Post -op Vital signs reviewed and stable  Post vital signs: Reviewed and stable  Last Vitals:  Vitals Value Taken Time  BP 154/71 04/01/2019  1:41 PM  Temp    Pulse 89 04/01/2019  1:44 PM  Resp 13 04/01/2019  1:44 PM  SpO2 97 % 04/01/2019  1:44 PM  Vitals shown include unvalidated device data.  Last Pain:  Vitals:   04/01/19 1212  TempSrc:   PainSc: 0-No pain      Patients Stated Pain Goal: 1 (31/49/70 2637)  Complications: No apparent anesthesia complications

## 2019-04-02 ENCOUNTER — Encounter: Payer: Self-pay | Admitting: Orthopedic Surgery

## 2019-04-02 LAB — BASIC METABOLIC PANEL
Anion gap: 10 (ref 5–15)
BUN: 28 mg/dL — ABNORMAL HIGH (ref 8–23)
CO2: 22 mmol/L (ref 22–32)
Calcium: 8.5 mg/dL — ABNORMAL LOW (ref 8.9–10.3)
Chloride: 107 mmol/L (ref 98–111)
Creatinine, Ser: 0.88 mg/dL (ref 0.44–1.00)
GFR calc Af Amer: >60 mL/min (ref >60)
GFR calc non Af Amer: 54 mL/min — ABNORMAL LOW (ref >60)
Glucose, Bld: 112 mg/dL — ABNORMAL HIGH (ref 70–99)
Potassium: 3.4 mmol/L — ABNORMAL LOW (ref 3.5–5.1)
Sodium: 139 mmol/L (ref 135–145)

## 2019-04-02 LAB — CBC
HCT: 28.6 % — ABNORMAL LOW (ref 36.0–46.0)
Hemoglobin: 9.5 g/dL — ABNORMAL LOW (ref 12.0–15.0)
MCH: 32.1 pg (ref 26.0–34.0)
MCHC: 33.2 g/dL (ref 30.0–36.0)
MCV: 96.6 fL (ref 80.0–100.0)
Platelets: 118 10*3/uL — ABNORMAL LOW (ref 150–400)
RBC: 2.96 MIL/uL — ABNORMAL LOW (ref 3.87–5.11)
RDW: 13.2 % (ref 11.5–15.5)
WBC: 8.7 10*3/uL (ref 4.0–10.5)
nRBC: 0 % (ref 0.0–0.2)

## 2019-04-02 MED ORDER — POLYETHYLENE GLYCOL 3350 17 G PO PACK
17.0000 g | PACK | Freq: Every day | ORAL | Status: DC
  Filled 2019-04-02: qty 1

## 2019-04-02 MED ORDER — POTASSIUM CHLORIDE CRYS ER 20 MEQ PO TBCR
40.0000 meq | EXTENDED_RELEASE_TABLET | Freq: Once | ORAL | Status: AC
  Administered 2019-04-02: 40 meq via ORAL
  Filled 2019-04-02: qty 2

## 2019-04-02 NOTE — Care Management Important Message (Signed)
Important Message  Patient Details  Name: [name] MRN: [id number] Date of Birth: 1918/09/08   Medicare Important Message Given:  Yes    [name] 04/02/2019, 12:14 PM

## 2019-04-02 NOTE — Evaluation (Signed)
Physical Therapy Evaluation Patient Details Name: Brittany Blackwell MRN: [id number] DOB: 1918/12/16 Today's Date: 04/02/2019   History of Present Illness  Brittany Blackwell is a 85 y.o. female who presented to the hospital ER through EMS after hitting life alert on 03/31/2019 who was in her usual state of health at home when she was walking in her kitchen and tripped over her shoes causing her to fall onto her right side.  She had sudden onset of right hip pain afterward, nonradiating, worse with movement, no alleviating factors, moderate to severe in intensity.  No head injury neck pain or loss of consciousness.  X-rays show a impacted subcapital femur fracture on the right hip and patient underwent R hip closed reduction with percutaneous pinning  04/01/2019. Relevant PMH includes macular degeneration and glaucoma, HTN, hypothyroidism, osteoporosis.     Clinical Impression  Patient reports that prior to hospitalization she lived home alone with a life alert, ambulated mod I with SPC, was I with ADLs, and required help with IADLs from family and outside help. Upon physical therapy evaluation, patient required mod A with trunk and legs to complete supint to sit, was able to sit at edge of bed without UE support, completed sit <> stand with mod A +2 standing at either side. x1 from/ to at edge of elevated bed, x1 edge of elevated bed to chair;  requiring cuing for hand placement and forward shift. Patient took several steps forward and backward between bed and chair using RW and min A +2. During ambulation she demo stooped posture and failed to fully extend knees and hips. Required cuing for stepping. Patient would benefit from physical therapy to address impairments and functional limitations to work towards return to PLOF or maximal functional independence. Recommend short term rehab upon discharge from hospital.      Follow Up Recommendations SNF    Equipment Recommendations  Rolling walker with 5" wheels     Recommendations for Other Services OT consult     Precautions / Restrictions Precautions Precautions: Fall Restrictions Weight Bearing Restrictions: Yes RLE Weight Bearing: Weight bearing as tolerated      Mobility  Bed Mobility Overal bed mobility: Needs Assistance Bed Mobility: Supine to Sit     Supine to sit: Mod assist     General bed mobility comments: required assistance with trunk and legs.   Transfers Overall transfer level: Needs assistance Equipment used: Rolling walker (2 wheeled) Transfers: Sit to/from Stand Sit to Stand: Mod assist;+2 physical assistance         General transfer comment: sit <> stand with mod A +2 standing at either side. x1 from/ to at edge of elevated bed, x1 edge of elevated bed to chair. required cuing for hand placement and forward shift.   Ambulation/Gait Ambulation/Gait assistance: +2 physical assistance;Min assist Gait Distance (Feet): 3 Feet Assistive device: Rolling walker (2 wheeled) Gait Pattern/deviations: Decreased stride length;Decreased weight shift to right;Shuffle Gait velocity: reduced   General Gait Details: took several steps forward and backward between bed and chair using RW and min A +2. Maintained stooped posture and failed to fully extend knees and hips. Required cuing for stepping.   Stairs            Wheelchair Mobility    Modified Rankin (Stroke Patients Only)       Balance Overall balance assessment: Needs assistance Sitting-balance support: No upper extremity supported Sitting balance-Leahy Scale: Fair Sitting balance - Comments: able to lift hands off bed in sitting.  Standing balance-Leahy Scale: Poor Standing balance comment: Requires BUE support and min A for standing balance.                              Pertinent Vitals/Pain Pain Assessment: Faces Faces Pain Scale: Hurts even more Pain Location: right hip when moving or weight bearing Pain Descriptors /  Indicators: Guarding;Grimacing Pain Intervention(s): Limited activity within patient's tolerance;Monitored during session(nursing gave medications prior to session)    Home Living Family/patient expects to be discharged to:: Private residence Living Arrangements: Alone Available Help at Discharge: Available PRN/intermittently;Family Type of Home: House Home Access: Stairs to enter Entrance Stairs-Rails: Left;Right;Can reach both Technical brewer of Steps: 3 Home Layout: One level Home Equipment: [place] - 4 wheels;Cane - single point;Bedside commode;Shower seat - built in Additional Comments: pt states she does not use O2 at home.     Prior Function Level of Independence: Needs assistance   Gait / Transfers Assistance Needed: ambulates with SPC  ADL's / Homemaking Assistance Needed: reports being I with ADLs, requires help with IADLs  Comments: Patient is A&Ox4 but demonstrates deficits in word finding and short term memory at times. She is aware she is having difficulty remembering.      Hand Dominance        Extremity/Trunk Assessment   Upper Extremity Assessment Upper Extremity Assessment: Generalized weakness    Lower Extremity Assessment Lower Extremity Assessment: Generalized weakness;RLE deficits/detail RLE Deficits / Details: limited ROM and strength in R LE and hip related to post op pain, stiffness, and weakness.  RLE: Unable to fully assess due to pain    Cervical / Trunk Assessment Cervical / Trunk Assessment: Other exceptions Cervical / Trunk Exceptions: Poor cervical spine control when reclined. kyphotic posture.   Communication   Communication: No difficulties  Cognition Arousal/Alertness: Awake/alert Behavior During Therapy: WFL for tasks assessed/performed Overall Cognitive Status: Within Functional Limits for tasks assessed                                 General Comments: patient reports she has difficulty with her memory but is  A&O x4 and expresses herself well. Evidence of some memory loss and word finding difficulties.       General Comments      Exercises Other Exercises Other Exercises: practiced hand placment and forward lean and correct use of RW with transfers.    Assessment/Plan    PT Assessment Patient needs continued PT services  PT Problem List Decreased strength;Decreased mobility;Decreased safety awareness;Decreased range of motion;Decreased coordination;Decreased knowledge of precautions;Decreased activity tolerance;Decreased cognition;Cardiopulmonary status limiting activity;Decreased balance;Decreased knowledge of use of DME;Pain;Decreased skin integrity       PT Treatment Interventions DME instruction;Functional mobility training;Balance training;Patient/family education;Gait training;Therapeutic activities;Neuromuscular re-education;Stair training;Therapeutic exercise    PT Goals (Current goals can be found in the Care Plan section)  Acute Rehab PT Goals Patient Stated Goal: return to PLOF and return home PT Goal Formulation: With patient Time For Goal Achievement: 04/16/19 Potential to Achieve Goals: Fair    Frequency BID   Barriers to discharge Decreased caregiver support patient needs 24/7 care at this point    Co-evaluation               AM-PAC PT "6 Clicks" Mobility  Outcome Measure Help needed turning from your back to your side while in a flat bed without using bedrails?: A  Lot Help needed moving from lying on your back to sitting on the side of a flat bed without using bedrails?: A Lot   Help needed standing up from a chair using your arms (e.g., wheelchair or bedside chair)?: A Lot Help needed to walk in hospital room?: Total Help needed climbing 3-5 steps with a railing? : Total 6 Click Score: 8    End of Session Equipment Utilized During Treatment: Gait belt;Oxygen Activity Tolerance: Patient tolerated treatment well;Patient limited by pain;Patient limited by  fatigue Patient left: with call bell/phone within reach;with chair alarm set Nurse Communication: Mobility status(results of session) PT Visit Diagnosis: Unsteadiness on feet (R26.81);Muscle weakness (generalized) (M62.81);History of falling (Z91.81);Difficulty in walking, not elsewhere classified (R26.2)    Time: 8280-0349 PT Time Calculation (min) (ACUTE ONLY): 45 min   Charges:   PT Evaluation $PT Eval Moderate Complexity: 1 Mod PT Treatments $Therapeutic Activity: 23-37 mins        Everlean Alstrom. Graylon Good, PT, DPT 04/02/19, 10:11 AM

## 2019-04-02 NOTE — Anesthesia Postprocedure Evaluation (Signed)
Anesthesia Post Note  Patient: Brittany Blackwell  Procedure(s) Performed: CANNULATED HIP PINNING (Right Hip)  Patient location during evaluation: Nursing Unit Anesthesia Type: Spinal Level of consciousness: awake Pain management: pain level controlled Vital Signs Assessment: post-procedure vital signs reviewed and stable Respiratory status: spontaneous breathing and respiratory function stable Cardiovascular status: blood pressure returned to baseline and stable Postop Assessment: no headache, no backache, no apparent nausea or vomiting and patient able to bend at knees Anesthetic complications: no     Last Vitals:  Vitals:   04/02/19 0348 04/02/19 0741  BP: (!) 150/69 137/71  Pulse: 83 82  Resp: 19 18  Temp: (!) 36.4 C 36.4 C  SpO2: 99% 93%    Last Pain:  Vitals:   04/02/19 0741  TempSrc: Oral  PainSc:                  Brantley Fling

## 2019-04-02 NOTE — TOC Progression Note (Signed)
Transition of Care [facility]) - Progression Note    Patient Details  Name: Brittany Blackwell MRN: [id number] Date of Birth: May 07, 1918  Transition of Care [facility]) CM/SW [place], [place] Phone Number: 04/02/2019, 1:48 PM  Clinical Narrative:   CSW presented bed offers to patient's son Brittany Blackwell. He chose bed at [facility] in [place] (previously [place]). CSW notified Rick at [facility] of bed acceptance. CSW will begin [employer] [place]. CSW will continue to follow for discharge planning.     Expected Discharge Plan: [place] Barriers to Discharge: Continued Medical Work up, SNF Pending bed offer  Expected Discharge Plan and Services Expected Discharge Plan: [place] arrangements for the past 2 months: Single Family Home                           Social Determinants of Health (SDOH) Interventions    Readmission Risk Interventions No flowsheet data found.

## 2019-04-02 NOTE — Progress Notes (Signed)
Patient ID: Brittany Blackwell, female   DOB: 09-26-1918, 85 y.o.   MRN: [id number]  [employer] PROGRESS NOTE  Brittany Blackwell:[id number] DOB: [birth year] DOA: 03/31/2019 PCP: [place] Pink, MD  HPI/Subjective: Patient feeling okay.  No chest pain or shortness of breath.  Some pain in her leg.  Objective: Vitals:   04/02/19 0348 04/02/19 0741  BP: (!) 150/69 137/71  Pulse: 83 82  Resp: 19 18  Temp: (!) 97.5 F (36.4 C) 97.6 F (36.4 C)  SpO2: 99% 93%    Filed Weights   03/31/19 1910 04/01/19 1212  Weight: 65.8 kg 65.8 kg    ROS: Review of Systems  Constitutional: Negative for chills and fever.  Eyes: Negative for blurred vision.  Respiratory: Negative for cough and shortness of breath.   Cardiovascular: Negative for chest pain.  Gastrointestinal: Negative for abdominal pain, constipation, diarrhea, nausea and vomiting.  Genitourinary: Negative for dysuria.  Musculoskeletal: Positive for joint pain.  Neurological: Negative for dizziness and headaches.   Exam: Physical Exam  Constitutional: She is oriented to person, place, and time.  HENT:  Nose: No mucosal edema.  Mouth/Throat: No oropharyngeal exudate or posterior oropharyngeal edema.  Eyes: Pupils are equal, round, and reactive to light. Conjunctivae, EOM and lids are normal.  Neck: No JVD present. Carotid bruit is not present. No edema present. No thyroid mass and no thyromegaly present.  Patient able to move her neck without a problem.  Cardiovascular: S1 normal and S2 normal. Exam reveals no gallop.  No murmur heard. Pulses:      Dorsalis pedis pulses are 2+ on the right side and 2+ on the left side.  Respiratory: No respiratory distress. She has no wheezes. She has no rhonchi. She has no rales.  GI: Soft. Bowel sounds are normal. There is no abdominal tenderness.  Musculoskeletal:     Right ankle: She exhibits no swelling.     Left ankle: She exhibits no swelling.  Lymphadenopathy:    She has no cervical  adenopathy.  Neurological: She is alert and oriented to person, place, and time. No cranial nerve deficit.  Skin: Skin is warm. No rash noted. Nails show no clubbing.  Psychiatric: She has a normal mood and affect.      Data Reviewed: Basic Metabolic Panel: Recent Labs  Lab 04/01/19 0323 04/02/19 0748  NA 138 139  K 3.8 3.4*  CL 105 107  CO2 22 22  GLUCOSE 110* 112*  BUN 37* 28*  CREATININE 0.91 0.88  CALCIUM 8.8* 8.5*   CBC: Recent Labs  Lab 04/01/19 0323 04/02/19 0748  WBC 9.7 8.7  HGB 11.1* 9.5*  HCT 33.6* 28.6*  MCV 95.2 96.6  PLT 142* 118*     Studies: Dg Chest 1 View  Result Date: 03/31/2019 CLINICAL DATA:  85 year old female with a fall EXAM: CHEST  1 VIEW COMPARISON:  03/24/2010 FINDINGS: Cardiomediastinal silhouette slightly enlarged from the prior, though potentially secondary to lower lung volumes. Interlobular septal thickening. No pneumothorax or pleural effusion. No confluent airspace disease. No displaced fracture. IMPRESSION: Coarsened interstitial markings may reflect chronic changes, though early pulmonary edema not excluded. Electronically Signed   By: Corrie Mckusick D.O.   On: 03/31/2019 19:52   Dg Pelvis 1-2 Views  Result Date: 03/31/2019 CLINICAL DATA:  Recent fall with hip pain, initial encounter EXAM: PELVIS - 1-2 VIEW COMPARISON:  None. FINDINGS: Pelvic ring is intact. Subcapital femoral neck fracture is noted with impaction and angulation at the fracture site. No  other focal abnormality is noted. IMPRESSION: Subcapital femoral neck fracture with impaction. Electronically Signed   By: Inez Catalina M.D.   On: 03/31/2019 19:51   Dg Hip Operative Unilat W Or W/o Pelvis Right  Result Date: 04/01/2019 CLINICAL DATA:  Right hip pinning EXAM: OPERATIVE right HIP (WITH PELVIS IF PERFORMED) 2 VIEWS TECHNIQUE: Fluoroscopic spot image(s) were submitted for interpretation post-operatively. COMPARISON:  03/31/2019 FINDINGS: Two images show placement of 3  Knowles pins for treatment of a femoral neck fracture. No radiographically detectable complication. IMPRESSION: Placement of 3 Knowles pins for treatment of a right femoral neck fracture. Electronically Signed   By: Nelson Chimes M.D.   On: 04/01/2019 14:15   Dg Femur Min 2 Views Right  Result Date: 03/31/2019 CLINICAL DATA:  85 year old female with fall EXAM: RIGHT FEMUR 2 VIEWS COMPARISON:  None. FINDINGS: Nondisplaced subcapital fracture of the right hip. Osteopenia.  Atherosclerosis. IMPRESSION: Nondisplaced subcapital right hip fracture. Electronically Signed   By: Corrie Mckusick D.O.   On: 03/31/2019 19:51    Scheduled Meds: . amLODipine  5 mg Oral Daily  . atenolol  50 mg Oral Daily  . brimonidine  1 drop Both Eyes BID   And  . timolol  1 drop Both Eyes BID  . budesonide  9 mg Oral Daily  . docusate sodium  100 mg Oral BID  . enoxaparin (LOVENOX) injection  30 mg Subcutaneous Q24H  . furosemide  20 mg Oral Daily  . latanoprost  1 drop Both Eyes QHS  . thyroid  90 mg Oral Daily   Continuous Infusions:   Assessment/Plan:  1. right hip fracture closed, requiring operative repair.  No contraindications to surgery at this time.  Patient looks good postoperative day 1.  Patient and family interested in [facility] rehab.  Social worker looking into things 2. Hypertension.  Continue Norvasc and atenolol 3. Hypothyroidism unspecified on [place] Thyroid 4. Glaucoma unspecified continue eyedrops  Code Status:     Code Status Orders  (From admission, onward)         Start     Ordered   03/31/19 2255  Full code  Continuous     03/31/19 2255        Code Status History    This patient has a current code status but no historical code status.     Family Communication: Spoke with son on the phone Disposition Plan: Likely out to rehab over the weekend  Consultants:  Orthopedic surgery  Time spent: 27 minutes  [place]

## 2019-04-02 NOTE — Progress Notes (Signed)
Subjective:  Patient reports pain as moderate.    Objective:   VITALS:   Vitals:   04/01/19 1919 04/01/19 2335 04/02/19 0348 04/02/19 0741  BP: (!) 154/67 (!) 141/70 (!) 150/69 137/71  Pulse: 89 94 83 82  Resp:  20 19 18   Temp: 99.4 F (37.4 C) 98 F (36.7 C) (!) 97.5 F (36.4 C) 97.6 F (36.4 C)  TempSrc: Oral Oral Oral Oral  SpO2: 96% 92% 99% 93%  Weight:      Height:        PHYSICAL EXAM:  Neurovascular intact Dorsiflexion/Plantar flexion intact Incision: dressing C/D/I Compartment soft  LABS  Results for orders placed or performed during the hospital encounter of 03/31/19 (from the past 24 hour(s))  CBC     Status: Abnormal   Collection Time: 04/02/19  7:48 AM  Result Value Ref Range   WBC 8.7 4.0 - 10.5 K/uL   RBC 2.96 (L) 3.87 - 5.11 MIL/uL   Hemoglobin 9.5 (L) 12.0 - 15.0 g/dL   HCT 28.6 (L) 36.0 - 46.0 %   MCV 96.6 80.0 - 100.0 fL   MCH 32.1 26.0 - 34.0 pg   MCHC 33.2 30.0 - 36.0 g/dL   RDW 13.2 11.5 - 15.5 %   Platelets 118 (L) 150 - 400 K/uL   nRBC 0.0 0.0 - 0.2 %  Basic metabolic panel     Status: Abnormal   Collection Time: 04/02/19  7:48 AM  Result Value Ref Range   Sodium 139 135 - 145 mmol/L   Potassium 3.4 (L) 3.5 - 5.1 mmol/L   Chloride 107 98 - 111 mmol/L   CO2 22 22 - 32 mmol/L   Glucose, Bld 112 (H) 70 - 99 mg/dL   BUN 28 (H) 8 - 23 mg/dL   Creatinine, Ser 0.88 0.44 - 1.00 mg/dL   Calcium 8.5 (L) 8.9 - 10.3 mg/dL   GFR calc non Af Amer 54 (L) >60 mL/min   GFR calc Af Amer >60 >60 mL/min   Anion gap 10 5 - 15    Dg Chest 1 View  Result Date: 03/31/2019 CLINICAL DATA:  85 year old female with a fall EXAM: CHEST  1 VIEW COMPARISON:  03/24/2010 FINDINGS: Cardiomediastinal silhouette slightly enlarged from the prior, though potentially secondary to lower lung volumes. Interlobular septal thickening. No pneumothorax or pleural effusion. No confluent airspace disease. No displaced fracture. IMPRESSION: Coarsened interstitial markings may  reflect chronic changes, though early pulmonary edema not excluded. Electronically Signed   By: Corrie Mckusick D.O.   On: 03/31/2019 19:52   Dg Pelvis 1-2 Views  Result Date: 03/31/2019 CLINICAL DATA:  Recent fall with hip pain, initial encounter EXAM: PELVIS - 1-2 VIEW COMPARISON:  None. FINDINGS: Pelvic ring is intact. Subcapital femoral neck fracture is noted with impaction and angulation at the fracture site. No other focal abnormality is noted. IMPRESSION: Subcapital femoral neck fracture with impaction. Electronically Signed   By: Inez Catalina M.D.   On: 03/31/2019 19:51   Dg Hip Operative Unilat W Or W/o Pelvis Right  Result Date: 04/01/2019 CLINICAL DATA:  Right hip pinning EXAM: OPERATIVE right HIP (WITH PELVIS IF PERFORMED) 2 VIEWS TECHNIQUE: Fluoroscopic spot image(s) were submitted for interpretation post-operatively. COMPARISON:  03/31/2019 FINDINGS: Two images show placement of 3 Knowles pins for treatment of a femoral neck fracture. No radiographically detectable complication. IMPRESSION: Placement of 3 Knowles pins for treatment of a right femoral neck fracture. Electronically Signed   By: Jan Fireman.D.  On: 04/01/2019 14:15   Dg Femur Min 2 Views Right  Result Date: 03/31/2019 CLINICAL DATA:  85 year old female with fall EXAM: RIGHT FEMUR 2 VIEWS COMPARISON:  None. FINDINGS: Nondisplaced subcapital fracture of the right hip. Osteopenia.  Atherosclerosis. IMPRESSION: Nondisplaced subcapital right hip fracture. Electronically Signed   By: Corrie Mckusick D.O.   On: 03/31/2019 19:51    Assessment/Plan: 1 Day Post-Op   Principal Problem:   Closed right hip fracture (HCC) Active Problems:   BP (high blood pressure)   Degeneration macular   Disease of thyroid gland   Up with therapy Discharge to SNF when medically stable WBAT Follow-up in 2 weeks Aspirin on discharge for DVT prophylaxis    Lovell Sheehan , MD 04/02/2019, 12:11 PM

## 2019-04-02 NOTE — Progress Notes (Signed)
Physical Therapy Treatment Patient Details Name: Brittany Blackwell MRN: [id number] DOB: 08/19/1918 Today's Date: 04/02/2019    History of Present Illness Brittany Blackwell is a 85 y.o. female who presented to the hospital ER through EMS after hitting life alert on 03/31/2019 who was in her usual state of health at home when she was walking in her kitchen and tripped over her shoes causing her to fall onto her right side.  She had sudden onset of right hip pain afterward, nonradiating, worse with movement, no alleviating factors, moderate to severe in intensity.  No head injury neck pain or loss of consciousness.  X-rays show a impacted subcapital femur fracture on the right hip and patient underwent R hip closed reduction with percutaneous pinning  04/01/2019. Relevant PMH includes macular degeneration and glaucoma, HTN, hypothyroidism, osteoporosis.     PT Comments    Patient tolerated treatment well but was limited by pain and fatigue. She is making good progress towards goals at this point. She performed sit <> stand with mod A +2 standing at either side. x3 from/to chair. required cuing for hand placement and forward shift. Attempted without O2 supplementation, but O2 sat fell to 88% with sit <> stand so re-applied 1L/min O2 for remainder of session. Patient ambulated 2x10 feet with RW and mod A +2 with second person providing chair follow and equpipment management. Patient fatigued quickly, took short steps, with backwards lean. Responded well to cuing to stand up to gain balance without backward lean. HR remained WFL throughout session; O2 sat remained in 90s with 1L/min O2 applied. Patient would benefit from continued physical therapy to address remaining impairments and functional limitations to work towards stated goals and return to PLOF or maximal functional independence.    Follow Up Recommendations  SNF     Equipment Recommendations  Rolling walker with 5" wheels    Recommendations for Other  Services OT consult     Precautions / Restrictions Precautions Precautions: Fall Restrictions Weight Bearing Restrictions: Yes RLE Weight Bearing: Weight bearing as tolerated    Mobility  Bed Mobility               General bed mobility comments: deferred, up in recliner  Transfers Overall transfer level: Needs assistance Equipment used: Rolling walker (2 wheeled) Transfers: Sit to/from Stand Sit to Stand: Mod assist;+2 physical assistance         General transfer comment: sit <> stand with mod A +2 standing at either side. x3 from/to chair. required cuing for hand placement and forward shift. Attempted without O2 supplementation, but O2 sat fell to 88% with sit <> stand so re-applied 1L/min O2 for remainder of session.   Ambulation/Gait Ambulation/Gait assistance: +2 physical assistance;Min assist Gait Distance (Feet): 10 Feet Assistive device: Rolling walker (2 wheeled) Gait Pattern/deviations: Decreased stride length;Decreased weight shift to right;Shuffle;Decreased step length - left;Leaning posteriorly;Step-to pattern Gait velocity: reduced   General Gait Details: patient ambulated 2x10 feet with RW and mod A +2 with second person providing chair follow and equpipment management. Patient fatigued quickly, took short steps, with backwards lean. Responded well to cuing to stand up to gain balance without backward lean.    Stairs             Wheelchair Mobility    Modified Rankin (Stroke Patients Only)       Balance Overall balance assessment: Needs assistance Sitting-balance support: No upper extremity supported Sitting balance-Leahy Scale: Fair Sitting balance - Comments: able to lift hands off  bed in sitting.      Standing balance-Leahy Scale: Poor Standing balance comment: Requires BUE support and min-mod A for standing balance.                             Cognition Arousal/Alertness: Awake/alert Behavior During Therapy: WFL for  tasks assessed/performed Overall Cognitive Status: Within Functional Limits for tasks assessed                                 General Comments: patient reports she has difficulty with her memory but is A&O x4 and expresses herself well. Evidence of some memory loss and word finding difficulties.       Exercises Other Exercises Other Exercises: practiced hand placment and forward lean and correct use of RW with transfers.  Other Exercises: Practiced correct AD positioning, advancement, and posture during ambulation.     General Comments        Pertinent Vitals/Pain Pain Assessment: Faces Faces Pain Scale: Hurts a little bit Pain Location: R hip at rest Pain Descriptors / Indicators: Aching;Guarding Pain Intervention(s): Limited activity within patient's tolerance    Home Living Family/patient expects to be discharged to:: Private residence Living Arrangements: Alone Available Help at Discharge: Available PRN/intermittently;Family Type of Home: House Home Access: Stairs to enter Entrance Stairs-Rails: Left;Right;Can reach both Home Layout: One level Home Equipment: Environmental consultant - 4 wheels;Cane - single point;Bedside commode;Shower seat - built Academic librarian Comments: pt states she does not use O2 at home.     Prior Function Level of Independence: Needs assistance  Gait / Transfers Assistance Needed: ambulates with SPC outside the home; endorses only 1 fall leading to this hospitalization in past 12 months ADL's / Homemaking Assistance Needed: reports being indep with ADL, medication mgt, and light meal prep; son provides transportation and delivers groceries. Pt reports she regularly exercises for her balance and strength  Comments: Patient is A&Ox4 but demonstrates deficits in word finding and short term memory at times. She is aware she is having difficulty remembering.    PT Goals (current goals can now be found in the care plan section) Acute Rehab  PT Goals Patient Stated Goal: return to PLOF and return home PT Goal Formulation: With patient Time For Goal Achievement: 04/16/19 Potential to Achieve Goals: Fair Progress towards PT goals: Progressing toward goals    Frequency    BID      PT Plan Current plan remains appropriate    Co-evaluation              AM-PAC PT "6 Clicks" Mobility   Outcome Measure  Help needed turning from your back to your side while in a flat bed without using bedrails?: A Lot Help needed moving from lying on your back to sitting on the side of a flat bed without using bedrails?: A Lot Help needed moving to and from a bed to a chair (including a wheelchair)?: A Lot Help needed standing up from a chair using your arms (e.g., wheelchair or bedside chair)?: A Lot Help needed to walk in hospital room?: A Lot Help needed climbing 3-5 steps with a railing? : Total 6 Click Score: 11    End of Session Equipment Utilized During Treatment: Gait belt;Oxygen Activity Tolerance: Patient tolerated treatment well;Patient limited by pain;Patient limited by fatigue Patient left: with call bell/phone within reach;with chair alarm set;in chair Nurse Communication:  Mobility status(results of session) PT Visit Diagnosis: Unsteadiness on feet (R26.81);Muscle weakness (generalized) (M62.81);History of falling (Z91.81);Difficulty in walking, not elsewhere classified (R26.2)     Time: 8209-9068 PT Time Calculation (min) (ACUTE ONLY): 35 min  Charges:  $Gait Training: 8-22 mins $Therapeutic Activity: 8-22 mins                     Everlean Alstrom. Graylon Good, PT, DPT 04/02/19, 2:47 PM

## 2019-04-02 NOTE — NC FL2 (Signed)
[place] LEVEL OF CARE SCREENING TOOL     IDENTIFICATION  Patient Name: Brittany Blackwell Birthdate: [birth year] Sex: female Admission Date (Current Location): 03/31/2019  [place] and [place] Number:  Engineering geologist and Address:  [facility], [address], [place], [place] [address]      Provider Number: [id number]  Attending Physician Name and Address:  Loletha Grayer, MD  Relative Name and Phone Number:       Current Level of Care: Hospital Recommended Level of Care: [place] Prior Approval Number:    Date Approved/Denied:   PASRR Number: [id number]  Discharge Plan: SNF    Current Diagnoses: Patient Active Problem List   Diagnosis Date Noted  . Closed right hip fracture ([place]) 03/31/2019  . Chicken pox 01/03/2016  . BP (high blood pressure) 01/03/2016  . Degeneration macular 01/03/2016  . Disease of thyroid gland 01/03/2016    Orientation RESPIRATION BLADDER Height & Weight     Self, Place  O2(1 liter ) Incontinent Weight: 145 lb 1 oz (65.8 kg) Height:  5\' 2"  (157.5 cm)  BEHAVIORAL SYMPTOMS/MOOD NEUROLOGICAL BOWEL NUTRITION STATUS  (none) (none) Continent Diet(Regular diet )  AMBULATORY STATUS COMMUNICATION OF NEEDS Skin   Extensive Assist Verbally Surgical wounds(Righ Leg incision )                       Personal Care Assistance Level of Assistance  Bathing, Feeding, Dressing Bathing Assistance: Limited assistance Feeding assistance: Independent Dressing Assistance: Limited assistance     Functional Limitations Info  Sight, Hearing, Speech Sight Info: Adequate Hearing Info: Adequate Speech Info: Adequate    SPECIAL CARE FACTORS FREQUENCY  PT (By licensed PT), OT (By licensed OT)     PT Frequency: 5 OT Frequency: 5            Contractures Contractures Info: Not present    Additional Factors Info  Code Status, Allergies Code Status Info: Full Code  Allergies Info: Demerol             Current Medications (04/02/2019):  This is the current hospital active medication list Current Facility-Administered Medications  Medication Dose Route Frequency Provider Last Rate Last Dose  . acetaminophen (TYLENOL) tablet 650 mg  650 mg Oral Q6H PRN Lovell Sheehan, MD       Or  . acetaminophen (TYLENOL) suppository 650 mg  650 mg Rectal Q6H PRN Lovell Sheehan, MD      . ALPRAZolam Duanne Moron) tablet 0.25 mg  0.25 mg Oral Daily PRN Lovell Sheehan, MD      . amLODipine (NORVASC) tablet 5 mg  5 mg Oral Daily Lovell Sheehan, MD   5 mg at 04/02/19 0904  . atenolol (TENORMIN) tablet 50 mg  50 mg Oral Daily Lovell Sheehan, MD   50 mg at 04/02/19 2703  . brimonidine (ALPHAGAN) 0.2 % ophthalmic solution 1 drop  1 drop Both Eyes BID Lovell Sheehan, MD   1 drop at 04/02/19 0906   And  . timolol (TIMOPTIC) 0.5 % ophthalmic solution 1 drop  1 drop Both Eyes BID Lovell Sheehan, MD   1 drop at 04/02/19 0906  . budesonide (ENTOCORT EC) 24 hr capsule 9 mg  9 mg Oral Daily Lovell Sheehan, MD   9 mg at 04/02/19 0905  . docusate sodium (COLACE) capsule 100 mg  100 mg Oral BID Lovell Sheehan, MD   100 mg at  04/02/19 0904  . enoxaparin (LOVENOX) injection 30 mg  30 mg Subcutaneous Q24H Lovell Sheehan, MD   30 mg at 04/02/19 8811  . furosemide (LASIX) tablet 20 mg  20 mg Oral Daily Lovell Sheehan, MD   20 mg at 04/02/19 0904  . latanoprost (XALATAN) 0.005 % ophthalmic solution 1 drop  1 drop Both Eyes QHS Lovell Sheehan, MD   1 drop at 04/01/19 2220  . metoCLOPramide (REGLAN) tablet 5-10 mg  5-10 mg Oral Q8H PRN Lovell Sheehan, MD       Or  . metoCLOPramide (REGLAN) injection 5-10 mg  5-10 mg Intravenous Q8H PRN Lovell Sheehan, MD      . morphine 2 MG/ML injection 2 mg  2 mg Intravenous Q4H PRN Lovell Sheehan, MD      . ondansetron [facility]) tablet 4 mg  4 mg Oral Q6H PRN Lovell Sheehan, MD       Or  . ondansetron [facility]) injection 4 mg  4 mg Intravenous Q6H PRN Lovell Sheehan, MD       . oxyCODONE (Oxy IR/ROXICODONE) immediate release tablet 5 mg  5 mg Oral Q4H PRN Lovell Sheehan, MD      . thyroid [facility]) tablet 90 mg  90 mg Oral Daily Lovell Sheehan, MD   Stopped at 04/01/19 [phone]     Discharge Medications: Please see discharge summary for a list of discharge medications.  Relevant Imaging Results:  Relevant Lab Results:   Additional Information SSN: [id number]  Annamaria Boots, [place]

## 2019-04-02 NOTE — Evaluation (Signed)
Occupational Therapy Evaluation Patient Details Name: Brittany Blackwell MRN: [id number] DOB: [birth year] Today's Date: 04/02/2019    History of Present Illness Brittany Blackwell is a 85 y.o. female who presented to the hospital ER through EMS after hitting life alert on 03/31/2019 who was in her usual state of health at home when she was walking in her kitchen and tripped over her shoes causing her to fall onto her right side.  She had sudden onset of right hip pain afterward, nonradiating, worse with movement, no alleviating factors, moderate to severe in intensity.  No head injury neck pain or loss of consciousness.  X-rays show a impacted subcapital femur fracture on the right hip and patient underwent R hip closed reduction with percutaneous pinning  04/01/2019. Relevant PMH includes macular degeneration and glaucoma, HTN, hypothyroidism, osteoporosis.    Clinical Impression   Pt seen for OT evaluation this date, POD#1 from above surgery. Pt was independent in all ADL prior to surgery, including light meal prep and medication mgt. Son assisted with groceries and transportation. Pt eager to return to PLOF and is "determined." Pt reports ambulating with SPC out in the community but did not need in the house. Denies any other falls in past 12 months and reports doing balance/strength exercises given to her by a previous HHPT to keep her strong. Pt currently requires mod-max assist for LB dressing and bathing while in seated position due to pain and limited AROM of R hip. Pt instructed in self care skills, falls prevention strategies, and home/routines modifications, DME/AE for LB bathing and dressing tasks. Pt verbalized understanding and would benefit from additional instruction in self care skills and techniques to help maintain precautions with or without assistive devices to support recall and carryover prior to discharge. Recommend STR upon discharge to best facilitate eventual return home.     Follow Up  Recommendations  SNF    Equipment Recommendations  Other (comment)(TBD)    Recommendations for Other Services       Precautions / Restrictions Precautions Precautions: Fall Restrictions Weight Bearing Restrictions: Yes RLE Weight Bearing: Weight bearing as tolerated      Mobility Bed Mobility     General bed mobility comments: deferred, up in recliner  Transfers Overall transfer level: Needs assistance Equipment used: Rolling walker (2 wheeled) Transfers: Sit to/from Stand Sit to Stand: Mod assist;+2 physical assistance         General transfer comment: sit <> stand with mod A +2 standing at either side. x1 from/ to at edge of elevated bed, x1 edge of elevated bed to chair. required cuing for hand placement and forward shift.     Balance Overall balance assessment: Needs assistance Sitting-balance support: No upper extremity supported Sitting balance-Leahy Scale: Fair Sitting balance - Comments: able to lift hands off bed in sitting.      Standing balance-Leahy Scale: Poor Standing balance comment: Requires BUE support and min A for standing balance.                            ADL either performed or assessed with clinical judgement   ADL Overall ADL's : Needs assistance/impaired Eating/Feeding: Sitting;Independent   Grooming: Sitting;Independent   Upper Body Bathing: Sitting;Set up;Min guard   Lower Body Bathing: Sit to/from stand;Moderate assistance;+2 for physical assistance Lower Body Bathing Details (indicate cue type and reason): +2 for transitional movements STS, Mod A for bathing Upper Body Dressing : Sitting;Set up;Min guard  Lower Body Dressing: Sit to/from stand;Moderate assistance;+2 for physical assistance;Maximal assistance Lower Body Dressing Details (indicate cue type and reason): +2 for transitional movements STS, Mod A for dressing, Max A for socks/shoes Toilet Transfer: BSC;Ambulation;RW;Minimal assistance;+2 for physical  assistance                   Vision Baseline Vision/History: Glaucoma;Macular Degeneration;Wears glasses Wears Glasses: At all times("but they don't really help"; uses magnifying glass when needed) Patient Visual Report: No change from baseline       Perception     Praxis      Pertinent Vitals/Pain Pain Assessment: Faces Faces Pain Scale: Hurts a little bit Pain Location: R hip at rest Pain Descriptors / Indicators: Aching;Guarding Pain Intervention(s): Limited activity within patient's tolerance     Hand Dominance Right   Extremity/Trunk Assessment Upper Extremity Assessment Upper Extremity Assessment: Generalized weakness(grossly 4-/5 bilat, AROM shoulder flexion bilat impaired pt reports due to fall and being sore)   Lower Extremity Assessment Lower Extremity Assessment: Generalized weakness;RLE deficits/detail RLE Deficits / Details: limited ROM and strength in R LE and hip related to post op pain, stiffness, and weakness.  RLE: Unable to fully assess due to pain   Cervical / Trunk Assessment Cervical / Trunk Assessment: Other exceptions Cervical / Trunk Exceptions: Poor cervical spine control when reclined. kyphotic posture.    Communication Communication Communication: No difficulties   Cognition Arousal/Alertness: Awake/alert Behavior During Therapy: WFL for tasks assessed/performed Overall Cognitive Status: Within Functional Limits for tasks assessed                                 General Comments: patient reports she has difficulty with her memory but is A&O x4 and expresses herself well. Evidence of some memory loss and word finding difficulties.    General Comments       Exercises Other Exercises Other Exercises: pt educated in falls prevention strategies, AE/DME for LB ADL tasks   Shoulder Instructions      Home Living Family/patient expects to be discharged to:: Private residence Living Arrangements: Alone Available Help at  Discharge: Available PRN/intermittently;Family Type of Home: House Home Access: Stairs to enter [employer] of Steps: 3 Entrance Stairs-Rails: Left;Right;Can reach both Home Layout: One level     Bathroom Shower/Tub: Occupational psychologist: Standard     Home Equipment: Environmental consultant - 4 wheels;Cane - single point;Bedside commode;Shower seat - built Hotel manager: Reacher Additional Comments: pt states she does not use O2 at home.       Prior Functioning/Environment Level of Independence: Needs assistance  Gait / Transfers Assistance Needed: ambulates with SPC outside the home; endorses only 1 fall leading to this hospitalization in past 12 months ADL's / Homemaking Assistance Needed: reports being indep with ADL, medication mgt, and light meal prep; son provides transportation and delivers groceries. Pt reports she regularly exercises for her balance and strength    Comments: Patient is A&Ox4 but demonstrates deficits in word finding and short term memory at times. She is aware she is having difficulty remembering.         OT Problem List: Decreased strength;Decreased range of motion;Decreased knowledge of use of DME or AE;Decreased activity tolerance;Impaired UE functional use;Pain;Impaired balance (sitting and/or standing)      OT Treatment/Interventions: Self-care/ADL training;Balance training;Therapeutic exercise;Therapeutic activities;DME and/or AE instruction;Patient/family education;Energy conservation    OT Goals(Current goals can be found in  the care plan section) Acute Rehab OT Goals Patient Stated Goal: return to PLOF and return home OT Goal Formulation: With patient Time For Goal Achievement: 04/16/19 Potential to Achieve Goals: Good ADL Goals Pt Will Perform Lower Body Dressing: with mod assist;sit to/from stand;with min assist Pt Will Transfer to Toilet: with mod assist;bedside commode;ambulating(mod A x1, LRAD for  amb) Additional ADL Goal #1: Pt will stand at sink with CGA to Min A to perform standing grooming tasks for 3 minutes with no LOB.  OT Frequency: Min 2X/week   Barriers to D/C: Decreased caregiver support          Co-evaluation              AM-PAC OT "6 Clicks" Daily Activity     Outcome Measure Help from another person eating meals?: None Help from another person taking care of personal grooming?: None Help from another person toileting, which includes using toliet, bedpan, or urinal?: A Lot Help from another person bathing (including washing, rinsing, drying)?: A Lot Help from another person to put on and taking off regular upper body clothing?: A Little Help from another person to put on and taking off regular lower body clothing?: A Lot 6 Click Score: 17   End of Session    Activity Tolerance: Patient tolerated treatment well Patient left: in chair;with call bell/phone within reach;with chair alarm set;with SCD's reapplied  OT Visit Diagnosis: Other abnormalities of gait and mobility (R26.89);Muscle weakness (generalized) (M62.81);History of falling (Z91.81);Pain Pain - Right/Left: Right Pain - part of body: Hip                Time: 8469-6295 OT Time Calculation (min): 21 min Charges:  OT General Charges $OT Visit: 1 Visit OT Evaluation $OT Eval Low Complexity: 1 Low OT Treatments $Self Care/Home Management : 8-22 mins  Jeni Salles, MPH, MS, OTR/L ascom [phone] 04/02/19, 12:02 PM

## 2019-04-02 NOTE — TOC Initial Note (Signed)
Transition of Care [facility]) - Initial/Assessment Note    Patient Details  Name: Brittany Blackwell MRN: [id number] Date of Birth: June 18, 1918  Transition of Care [facility]) CM/SW Contact:    Annamaria Boots, [place] Phone Number: 04/02/2019, 10:21 AM  Clinical Narrative:  CSW consulted for facility placement. CSW contacted patient's legal Francee Piccolo [phone]. Son states that patient was living alone but has family checking on her throughout the day. Son reports that he would like patient to go to rehab before returning home and his preference is [facility] in [place] whom he has already spoken to and made arrangements for patient. CSW explained bed search process and insurance authorization process. Son in agreement with bed search. CSW will continue to follow for discharge planning.                 Expected Discharge Plan: Skilled Nursing Facility Barriers to Discharge: Continued Medical Work up, SNF Pending bed offer   Patient Goals and CMS Choice Patient states their goals for this hospitalization and ongoing recovery are:: " I want her to go to rehab" - Son  CMS Medicare.gov Compare Post Acute Care list provided to:: Patient Represenative (must comment)(Son ) Choice offered to / list presented to : Adult Children  Expected Discharge Plan and Services Expected Discharge Plan: [place] arrangements for the past 2 months: Single Family Home                          Prior Living Arrangements/Services Living arrangements for the past 2 months: Single Family Home Lives with:: Self Patient language and need for interpreter reviewed:: Yes Do you feel safe going back to the place where you live?: Yes      Need for Family Participation in Patient Care: Yes (Comment) Care giver support system in place?: Yes (comment)   Criminal Activity/Legal Involvement Pertinent to Current Situation/Hospitalization: No - Comment as needed  Activities of Daily  Living Home Assistive Devices/Equipment: Walker (specify type) ADL Screening (condition at time of admission) Patient's cognitive ability adequate to safely complete daily activities?: Yes Is the patient deaf or have difficulty hearing?: No Does the patient have difficulty seeing, even when wearing glasses/contacts?: No Does the patient have difficulty concentrating, remembering, or making decisions?: No Patient able to express need for assistance with ADLs?: Yes Does the patient have difficulty dressing or bathing?: Yes Independently performs ADLs?: No Communication: Independent Dressing (OT): Needs assistance Is this a change from baseline?: Pre-admission baseline Grooming: Needs assistance Is this a change from baseline?: Pre-admission baseline Feeding: Independent Bathing: Needs assistance Is this a change from baseline?: Pre-admission baseline Toileting: Needs assistance Is this a change from baseline?: Pre-admission baseline In/Out Bed: Needs assistance Is this a change from baseline?: Pre-admission baseline Walks in Home: Needs assistance Is this a change from baseline?: Pre-admission baseline Does the patient have difficulty walking or climbing stairs?: Yes Weakness of Legs: Both Weakness of Arms/Hands: Both  Permission Sought/Granted Permission sought to share information with : Case Manager, Customer service manager, Family Supports Permission granted to share information with : Yes, Verbal Permission Granted              Emotional Assessment Appearance:: Appears stated age   Affect (typically observed): Quiet, Restless Orientation: : Oriented to Self, Oriented to Place Alcohol / Substance Use: Not Applicable Psych Involvement: No (comment)  Admission diagnosis:  Subcapital fracture of femur, left, closed,  initial encounter [facility]) [S72.012A] Patient Active Problem List   Diagnosis Date Noted  . Closed right hip fracture ([place]) 03/31/2019  . Chicken pox  01/03/2016  . BP (high blood pressure) 01/03/2016  . Degeneration macular 01/03/2016  . Disease of thyroid gland 01/03/2016   PCP:  [place] Pink, MD Pharmacy:   [facility] [place] [address] Phone: [phone] Fax: [place], [address] [place] [address] Phone: [phone] Fax: [phone]     Social Determinants of Health (SDOH) Interventions    Readmission Risk Interventions No flowsheet data found.

## 2019-04-03 LAB — SARS CORONAVIRUS 2 BY RT PCR (HOSPITAL ORDER, PERFORMED IN ~~LOC~~ HOSPITAL LAB): SARS Coronavirus 2: NEGATIVE

## 2019-04-03 MED ORDER — ACETAMINOPHEN 325 MG PO TABS: 650.0000 mg | ORAL_TABLET | Freq: Four times a day (QID) | ORAL | Status: AC | PRN

## 2019-04-03 MED ORDER — ENOXAPARIN SODIUM 30 MG/0.3ML ~~LOC~~ SOLN: 30.0000 mg | SUBCUTANEOUS | 0 refills | Status: DC

## 2019-04-03 MED ORDER — OXYCODONE HCL 5 MG PO TABS: 5.0000 mg | ORAL_TABLET | ORAL | 0 refills | Status: DC | PRN

## 2019-04-03 MED ORDER — ASPIRIN EC 81 MG PO TBEC: 81.0000 mg | DELAYED_RELEASE_TABLET | Freq: Every day | ORAL | 2 refills | Status: DC

## 2019-04-03 MED ORDER — DOCUSATE SODIUM 100 MG PO CAPS: 100.0000 mg | ORAL_CAPSULE | Freq: Two times a day (BID) | ORAL | 0 refills | Status: DC

## 2019-04-03 NOTE — Progress Notes (Signed)
CSW spoke with [facility] SN[facility] regarding patient being able to discharge from hospital today. CSW was informed that facility does not take any admissions over the weekend.   CSW left contact information in the event admission representative arrived to facility and could clarify when they are able to accept patient.   Kingsley Spittle, LCSW Transitions of Care Department System Wide Float  [phone]

## 2019-04-03 NOTE — Discharge Summary (Signed)
Brittany Blackwell at [facility], 85 y.o., DOB January 28, 1918, MRN [id number]. Admission date: 03/31/2019 Discharge Date 04/03/2019 Primary MD [place] Pink, MD Admitting Physician Lance Coon, MD  Admission Diagnosis  Subcapital fracture of femur, left, closed, initial encounter [facility]) [S72.012A]  Discharge Diagnosis   Principal Problem: Right hip fracture closed status post repair Hypertension Hypothyroidism Glaucoma GERD     Hospital Course  Brittany Blackwell  is a 85 y.o. female who presents with chief complaint as above.  Patient had mechanical fall at home this evening with subsequent right hip pain.  Here in the ED she was found on imaging to have a right hip fracture.  Patient was admitted and seen by orthopedic surgery and underwent right hip fracture repair.  Patient is doing well.  The nursing home that she was going to requested COVID-19 test which has been done and is negative.            Consults  orthopedic surgery  Significant Tests:  See full reports for all details     Dg Chest 1 View  Result Date: 03/31/2019 CLINICAL DATA:  85 year old female with a fall EXAM: CHEST  1 VIEW COMPARISON:  03/24/2010 FINDINGS: Cardiomediastinal silhouette slightly enlarged from the prior, though potentially secondary to lower lung volumes. Interlobular septal thickening. No pneumothorax or pleural effusion. No confluent airspace disease. No displaced fracture. IMPRESSION: Coarsened interstitial markings may reflect chronic changes, though early pulmonary edema not excluded. Electronically Signed   By: Corrie Mckusick D.O.   On: 03/31/2019 19:52   Dg Pelvis 1-2 Views  Result Date: 03/31/2019 CLINICAL DATA:  Recent fall with hip pain, initial encounter EXAM: PELVIS - 1-2 VIEW COMPARISON:  None. FINDINGS: Pelvic ring is intact. Subcapital femoral neck fracture is noted with impaction and angulation at the fracture site. No other focal abnormality is noted.  IMPRESSION: Subcapital femoral neck fracture with impaction. Electronically Signed   By: Inez Catalina M.D.   On: 03/31/2019 19:51   Dg Hip Operative Unilat W Or W/o Pelvis Right  Result Date: 04/01/2019 CLINICAL DATA:  Right hip pinning EXAM: OPERATIVE right HIP (WITH PELVIS IF PERFORMED) 2 VIEWS TECHNIQUE: Fluoroscopic spot image(s) were submitted for interpretation post-operatively. COMPARISON:  03/31/2019 FINDINGS: Two images show placement of 3 Knowles pins for treatment of a femoral neck fracture. No radiographically detectable complication. IMPRESSION: Placement of 3 Knowles pins for treatment of a right femoral neck fracture. Electronically Signed   By: Nelson Chimes M.D.   On: 04/01/2019 14:15   Dg Femur Min 2 Views Right  Result Date: 03/31/2019 CLINICAL DATA:  85 year old female with fall EXAM: RIGHT FEMUR 2 VIEWS COMPARISON:  None. FINDINGS: Nondisplaced subcapital fracture of the right hip. Osteopenia.  Atherosclerosis. IMPRESSION: Nondisplaced subcapital right hip fracture. Electronically Signed   By: Corrie Mckusick D.O.   On: 03/31/2019 19:51       Today   Subjective:   [name] patient denying any complaints Objective:   Blood pressure (!) 151/64, pulse 66, temperature 98.3 F (36.8 C), temperature source Oral, resp. rate 18, height 5\' 2"  (1.575 m), weight 65.8 kg, SpO2 95 %.  .  Intake/Output Summary (Last 24 hours) at 04/03/2019 1331 Last data filed at 04/03/2019 0617 Gross per 24 hour  Intake -  Output 250 ml  Net -250 ml    Exam VITAL SIGNS: Blood pressure (!) 151/64, pulse 66, temperature 98.3 F (36.8 C), temperature source Oral, resp. rate 18, height 5\' 2"  (1.575  m), weight 65.8 kg, SpO2 95 %.  GENERAL:  85 y.o.-year-old patient lying in the bed with no acute distress.  EYES: Pupils equal, round, reactive to light and accommodation. No scleral icterus. Extraocular muscles intact.  HEENT: Head atraumatic, normocephalic. Oropharynx and nasopharynx clear.   NECK:  Supple, no jugular venous distention. No thyroid enlargement, no tenderness.  LUNGS: Normal breath sounds bilaterally, no wheezing, rales,rhonchi or crepitation. No use of accessory muscles of respiration.  CARDIOVASCULAR: S1, S2 normal. No murmurs, rubs, or gallops.  ABDOMEN: Soft, nontender, nondistended. Bowel sounds present. No organomegaly or mass.  EXTREMITIES: No pedal edema, cyanosis, or clubbing.  NEUROLOGIC: Cranial nerves II through XII are intact. Muscle strength 5/5 in all extremities. Sensation intact. Gait not checked.  PSYCHIATRIC: The patient is alert and oriented x 3.  SKIN: No obvious rash, lesion, or ulcer.   Data Review     CBC w Diff:  Lab Results  Component Value Date   WBC 8.7 04/02/2019   HGB 9.5 (L) 04/02/2019   HCT 28.6 (L) 04/02/2019   PLT 118 (L) 04/02/2019   CMP:  Lab Results  Component Value Date   NA 139 04/02/2019   K 3.4 (L) 04/02/2019   CL 107 04/02/2019   CO2 22 04/02/2019   BUN 28 (H) 04/02/2019   CREATININE 0.88 04/02/2019  .  Micro Results Recent Results (from the past 240 hour(s))  Surgical pcr screen     Status: None   Collection Time: 04/01/19  5:48 AM  Result Value Ref Range Status   MRSA, PCR NEGATIVE NEGATIVE Final   Staphylococcus aureus NEGATIVE NEGATIVE Final    Comment: (NOTE) The Xpert SA Assay (FDA approved for NASAL specimens in patients 62 years of age and older), is one component of a comprehensive surveillance program. It is not intended to diagnose infection nor to guide or monitor treatment. Performed at [facility], [address] [place], [place] [address]   SARS Coronavirus 2 [facility] order, Performed in [facility] hospital lab)     Status: None   Collection Time: 04/03/19 10:25 AM  Result Value Ref Range Status   SARS Coronavirus 2 NEGATIVE NEGATIVE Final    Comment: (NOTE) If result is NEGATIVE SARS-CoV-2 target nucleic acids are NOT DETECTED. The SARS-CoV-2 RNA is generally  detectable in upper and lower  respiratory specimens during the acute phase of infection. The lowest  concentration of SARS-CoV-2 viral copies this assay can detect is 250  copies / mL. A negative result does not preclude SARS-CoV-2 infection  and should not be used as the sole basis for treatment or other  patient management decisions.  A negative result may occur with  improper specimen collection / handling, submission of specimen other  than nasopharyngeal swab, presence of viral mutation(s) within the  areas targeted by this assay, and inadequate number of viral copies  (<250 copies / mL). A negative result must be combined with clinical  observations, patient history, and epidemiological information. If result is POSITIVE SARS-CoV-2 target nucleic acids are DETECTED. The SARS-CoV-2 RNA is generally detectable in upper and lower  respiratory specimens dur ing the acute phase of infection.  Positive  results are indicative of active infection with SARS-CoV-2.  Clinical  correlation with patient history and other diagnostic information is  necessary to determine patient infection status.  Positive results do  not rule out bacterial infection or co-infection with other viruses. If result is PRESUMPTIVE POSTIVE SARS-CoV-2 nucleic acids MAY BE PRESENT.  A presumptive positive result was obtained on the submitted specimen  and confirmed on repeat testing.  While 2019 novel coronavirus  (SARS-CoV-2) nucleic acids may be present in the submitted sample  additional confirmatory testing may be necessary for epidemiological  and / or clinical management purposes  to differentiate between  SARS-CoV-2 and other Sarbecovirus currently known to infect humans.  If clinically indicated additional testing with an alternate test  methodology [id number]) is advised. The SARS-CoV-2 RNA is generally  detectable in upper and lower respiratory sp ecimens during the acute  phase of infection. The  expected result is Negative. Fact Sheet for Patients:  StrictlyIdeas.no Fact Sheet for Healthcare Providers: BankingDealers.co.za This test is not yet approved or cleared by the [place] FDA and has been authorized for detection and/or diagnosis of SARS-CoV-2 by FDA under an Emergency Use Authorization (EUA).  This EUA will remain in effect (meaning this test can be used) for the duration of the COVID-19 declaration under Section 564(b)(1) of the Act, 21 U.S.C. section 360bbb-3(b)(1), unless the authorization is terminated or revoked sooner. Performed at [facility], [address] [place], [place] [address]         Code Status Orders  (From admission, onward)         Start     Ordered   03/31/19 2255  Full code  Continuous     03/31/19 2255        Code Status History    This patient has a current code status but no historical code status.    Advance Directive Documentation     Most Recent Value  Type of Advance Directive  Healthcare Power of Attorney  Pre-existing out of facility DNR order (yellow form or pink MOST form)  -  "MOST" Form in Place?  -           Contact information for follow-up providers    Lovell Sheehan, MD Follow up in 2 week(s).   Specialty:  Orthopedic Surgery Contact information: [place] [address] [phone]            Contact information for after-discharge care    Destination    [facility] .   Service:  Skilled Nursing Contact information: [address] [place] [phone]                  Discharge Medications   Allergies as of 04/03/2019      Reactions   Demerol [meperidine] Other (See Comments)   BP drops      Medication List    TAKE these medications   acetaminophen 325 MG tablet Commonly known as:  TYLENOL Take 2 tablets (650 mg total) by mouth every 6  (six) hours as needed for mild pain (or Fever >/= 101).   ALPRAZolam 0.25 MG tablet Commonly known as:  XANAX Take 1 tablet by mouth daily as needed.   amLODipine 5 MG tablet Commonly known as:  NORVASC Take 5 mg by mouth daily.   [place] Thyroid 90 MG tablet Generic drug:  thyroid Take 90 mg by mouth daily.   aspirin EC 81 MG tablet Take 1 tablet (81 mg total) by mouth daily.   atenolol 50 MG tablet Commonly known as:  TENORMIN Take 50 mg by mouth daily.   budesonide 3 MG 24 hr capsule Commonly known as:  ENTOCORT EC Take 3 capsules by mouth daily.   Combigan 0.2-0.5 % ophthalmic  solution Generic drug:  brimonidine-timolol Place 1 drop into both eyes every 12 (twelve) hours.   docusate sodium 100 MG capsule Commonly known as:  COLACE Take 1 capsule (100 mg total) by mouth 2 (two) times daily.   furosemide 20 MG tablet Commonly known as:  LASIX Take 20 mg by mouth daily.   latanoprost 0.005 % ophthalmic solution Commonly known as:  XALATAN Place 1 drop into both eyes at bedtime.   oxyCODONE 5 MG immediate release tablet Commonly known as:  Oxy IR/ROXICODONE Take 1 tablet (5 mg total) by mouth every 4 (four) hours as needed for moderate pain.   potassium chloride SA 20 MEQ tablet Commonly known as:  K-DUR Take 20 mEq by mouth 2 (two) times daily.   timolol 0.5 % ophthalmic solution Commonly known as:  TIMOPTIC Place 1 drop into both eyes daily.          Total Time in preparing paper work, data evaluation and todays exam - 7 minutes  Dustin Flock M.D on 04/03/2019 at [place]  [phone]

## 2019-04-03 NOTE — Progress Notes (Signed)
Physical Therapy Treatment Patient Details Name: Brittany Blackwell MRN: [id number] DOB: 01-27-1918 Today's Date: 04/03/2019    History of Present Illness Brittany Blackwell is a 85 y.o. female who presented to the hospital ER through EMS after hitting life alert on 03/31/2019 who was in her usual state of health at home when she was walking in her kitchen and tripped over her shoes causing her to fall onto her right side.  She had sudden onset of right hip pain afterward, nonradiating, worse with movement, no alleviating factors, moderate to severe in intensity.  No head injury neck pain or loss of consciousness.  X-rays show a impacted subcapital femur fracture on the right hip and patient underwent R hip closed reduction with percutaneous pinning  04/01/2019. Relevant PMH includes macular degeneration and glaucoma, HTN, hypothyroidism, osteoporosis.     PT Comments    Participated in exercises as described below.  To edge of bed with increased time and mod a x 1.  Slow guarded movements but good effort.  Once sitting edge of bed min guard for safety.  Stood with mod a x 1 to walker and was able to take several small steps to recliner with mod a x 1.  Increased difficulty with mobility today which pt attributes to increased soreness from activity yesterday and a wave of nausea which passed once sitting.  Remained in recliner after session.   Follow Up Recommendations  SNF     Equipment Recommendations  Rolling walker with 5" wheels    Recommendations for Other Services       Precautions / Restrictions Precautions Precautions: Fall Restrictions Weight Bearing Restrictions: Yes RLE Weight Bearing: Weight bearing as tolerated    Mobility  Bed Mobility Overal bed mobility: Needs Assistance Bed Mobility: Supine to Sit     Supine to sit: Mod assist        Transfers Overall transfer level: Needs assistance Equipment used: Rolling walker (2 wheeled) Transfers: Sit to/from Stand Sit to  Stand: Mod assist            Ambulation/Gait Ambulation/Gait assistance: Mod assist Gait Distance (Feet): 3 Feet Assistive device: Rolling walker (2 wheeled) Gait Pattern/deviations: Step-to pattern;Decreased step length - right;Decreased step length - left;Trunk flexed Gait velocity: reduced   General Gait Details: Pt reports increased soreness today with increased difficulty with gait   Stairs             Wheelchair Mobility    Modified Rankin (Stroke Patients Only)       Balance Overall balance assessment: Needs assistance Sitting-balance support: No upper extremity supported Sitting balance-Leahy Scale: Fair Sitting balance - Comments: able to lift hands off bed in sitting.      Standing balance-Leahy Scale: Poor Standing balance comment: Requires BUE support and min-mod A for standing balance.                             Cognition Arousal/Alertness: Awake/alert Behavior During Therapy: WFL for tasks assessed/performed Overall Cognitive Status: Within Functional Limits for tasks assessed                                        Exercises Other Exercises Other Exercises: supine AAROM R hip for ankle pumps, heel slides, ab/add and SLR x 10.    General Comments  Pertinent Vitals/Pain Pain Assessment: Faces Faces Pain Scale: Hurts even more Pain Location: R hip Pain Descriptors / Indicators: Aching;Guarding Pain Intervention(s): Limited activity within patient's tolerance;Monitored during session    Home Living                      Prior Function            PT Goals (current goals can now be found in the care plan section) Progress towards PT goals: Progressing toward goals    Frequency    BID      PT Plan Current plan remains appropriate    Co-evaluation              AM-PAC PT "6 Clicks" Mobility   Outcome Measure  Help needed turning from your back to your side while in a flat bed  without using bedrails?: A Lot Help needed moving from lying on your back to sitting on the side of a flat bed without using bedrails?: A Lot Help needed moving to and from a bed to a chair (including a wheelchair)?: A Lot Help needed standing up from a chair using your arms (e.g., wheelchair or bedside chair)?: A Lot Help needed to walk in hospital room?: A Lot Help needed climbing 3-5 steps with a railing? : Total 6 Click Score: 11    End of Session Equipment Utilized During Treatment: Gait belt;Oxygen Activity Tolerance: Patient tolerated treatment well;Patient limited by pain;Patient limited by fatigue Patient left: in chair;with call bell/phone within reach;with chair alarm set Nurse Communication: Mobility status       Time: 0950-1005 PT Time Calculation (min) (ACUTE ONLY): 15 min  Charges:  $Therapeutic Exercise: 8-22 mins                     Chesley Noon, PTA 04/03/19, 10:52 AM

## 2019-04-03 NOTE — Progress Notes (Signed)
Physical Therapy Treatment Patient Details Name: Brittany Blackwell MRN: [id number] DOB: [birth year] Today's Date: 04/03/2019    History of Present Illness Brittany Blackwell is a 85 y.o. female who presented to the hospital ER through EMS after hitting life alert on 03/31/2019 who was in her usual state of health at home when she was walking in her kitchen and tripped over her shoes causing her to fall onto her right side.  She had sudden onset of right hip pain afterward, nonradiating, worse with movement, no alleviating factors, moderate to severe in intensity.  No head injury neck pain or loss of consciousness.  X-rays show a impacted subcapital femur fracture on the right hip and patient underwent R hip closed reduction with percutaneous pinning  04/01/2019. Relevant PMH includes macular degeneration and glaucoma, HTN, hypothyroidism, osteoporosis.     PT Comments    Patient performs sit to stand transfers with mod assist x 1 and RW with flexed trunk and slow movement after several attempts to stand up from the recliner. She ambulates 3 steps with RW and mod assist with poor posture and very slow movement. She performs bed mobility with mod assist and poor control of trunk to supine and lying across the bed horizontally due to inability to control her trunk. She needs max assist to scoot up to the head of the bed and get positioned correctly in the bed. She will continue to benefit from skilled PT to improve mobility.    Follow Up Recommendations  SNF     Equipment Recommendations  Rolling walker with 5" wheels    Recommendations for Other Services       Precautions / Restrictions Precautions Precautions: Fall Restrictions Weight Bearing Restrictions: Yes RLE Weight Bearing: (RLE WBAT)    Mobility  Bed Mobility Overal bed mobility: Needs Assistance Bed Mobility: Supine to Sit;Sit to Supine     Supine to sit: Mod assist Sit to supine: Mod assist      Transfers Overall transfer level:  Needs assistance Equipment used: Rolling walker (2 wheeled) Transfers: Sit to/from Stand Sit to Stand: Mod assist         General transfer comment: flexed trunk  Ambulation/Gait Ambulation/Gait assistance: Mod assist Gait Distance (Feet): 3 Feet Assistive device: Rolling walker (2 wheeled) Gait Pattern/deviations: Step-to pattern Gait velocity: reduced   General Gait Details: Pt reports increased soreness today with increased difficulty with gait   Stairs             Wheelchair Mobility    Modified Rankin (Stroke Patients Only)       Balance Overall balance assessment: Needs assistance Sitting-balance support: Bilateral upper extremity supported Sitting balance-Leahy Scale: Fair Sitting balance - Comments: able to lift hands off bed in sitting.    Standing balance support: Bilateral upper extremity supported Standing balance-Leahy Scale: Poor Standing balance comment: Requires BUE support and min-mod A for standing balance.                             Cognition Arousal/Alertness: Awake/alert Behavior During Therapy: WFL for tasks assessed/performed Overall Cognitive Status: Within Functional Limits for tasks assessed                                        Exercises General Exercises - Lower Extremity Ankle Circles/Pumps: 10 reps Long Arc Quad: AROM;10 reps Other  Exercises Other Exercises: supine AAROM R hip for ankle pumps, heel slides, ab/add and SLR x 10.    General Comments        Pertinent Vitals/Pain Pain Assessment: 0-10 Pain Score: 5  Faces Pain Scale: Hurts even more Pain Location: (R hip) Pain Descriptors / Indicators: Aching;Guarding Pain Intervention(s): Limited activity within patient's tolerance;Monitored during session    Home Living                      Prior Function            PT Goals (current goals can now be found in the care plan section) Acute Rehab PT Goals Patient Stated Goal:  (to walk) PT Goal Formulation: With patient Time For Goal Achievement: 04/24/19 Potential to Achieve Goals: Fair Progress towards PT goals: Progressing toward goals    Frequency    BID      PT Plan Current plan remains appropriate    Co-evaluation              AM-PAC PT "6 Clicks" Mobility   Outcome Measure  Help needed turning from your back to your side while in a flat bed without using bedrails?: A Lot Help needed moving from lying on your back to sitting on the side of a flat bed without using bedrails?: A Lot Help needed moving to and from a bed to a chair (including a wheelchair)?: A Lot Help needed standing up from a chair using your arms (e.g., wheelchair or bedside chair)?: A Lot Help needed to walk in hospital room?: A Lot Help needed climbing 3-5 steps with a railing? : A Lot 6 Click Score: 12    End of Session Equipment Utilized During Treatment: Gait belt;Oxygen Activity Tolerance: Patient tolerated treatment well;Patient limited by pain;Patient limited by fatigue Patient left: in chair;with call bell/phone within reach;with chair alarm set Nurse Communication: Mobility status PT Visit Diagnosis: Unsteadiness on feet (R26.81);Muscle weakness (generalized) (M62.81);History of falling (Z91.81);Difficulty in walking, not elsewhere classified (R26.2)     Time: 0165-5374 PT Time Calculation (min) (ACUTE ONLY): 40 min  Charges:  $Therapeutic Exercise: 8-22 mins $Therapeutic Activity: 23-37 mins                        Alanson Puls, PT DPT 04/03/2019, 2:44 PM

## 2019-04-03 NOTE — Progress Notes (Signed)
  Subjective:  POD #2 s/p percutaneous fixation for right femoral neck hip fracture.  Patient is up out of bed to a chair.  She was seen in her hospital room with her nurse at the bedside.  Patient reports right hip pain as mild at rest and moderate with movement of the right hip.  She denies shortness of breath, chest pain or abdominal pain.  Objective:   VITALS:   Vitals:   04/02/19 0741 04/02/19 1507 04/02/19 2319 04/03/19 0736  BP: 137/71 125/65 (!) 149/88 (!) 151/64  Pulse: 82 60 65 66  Resp: 18 (!) 22 18 18   Temp: 97.6 F (36.4 C) 98.2 F (36.8 C) 97.7 F (36.5 C) 98.3 F (36.8 C)  TempSrc: Oral Oral Oral Oral  SpO2: 93% 97% 98% 95%  Weight:      Height:        PHYSICAL EXAM: Right ower extremity: Neurovascular intact Sensation intact distally Intact pulses distally Dorsiflexion/Plantar flexion intact Incision: scant drainage No cellulitis present Compartment soft  LABS  Results for orders placed or performed during the hospital encounter of 03/31/19 (from the past 24 hour(s))  SARS Coronavirus 2 [facility] order, Performed in [place] hospital lab)     Status: None   Collection Time: 04/03/19 10:25 AM  Result Value Ref Range   SARS Coronavirus 2 NEGATIVE NEGATIVE    Dg Hip Operative Unilat W Or W/o Pelvis Right  Result Date: 04/01/2019 CLINICAL DATA:  Right hip pinning EXAM: OPERATIVE right HIP (WITH PELVIS IF PERFORMED) 2 VIEWS TECHNIQUE: Fluoroscopic spot image(s) were submitted for interpretation post-operatively. COMPARISON:  03/31/2019 FINDINGS: Two images show placement of 3 Knowles pins for treatment of a femoral neck fracture. No radiographically detectable complication. IMPRESSION: Placement of 3 Knowles pins for treatment of a right femoral neck fracture. Electronically Signed   By: Nelson Chimes M.D.   On: 04/01/2019 14:15    Assessment/Plan: 2 Days Post-Op   Principal Problem:   Closed right hip fracture (HCC) Active Problems:   BP (high blood  pressure)   Degeneration macular   Disease of thyroid gland  Patient is doing well from an orthopedic standpoint postop.  Patient is weightbearing as tolerated on the right lower extremity.  She will receive aspirin 81 mg daily for DVT prophylaxis upon discharge he will follow-up.  Follow-up with Dr. Harlow Mares in clinic in 2 weeks.    Thornton Park , MD 04/03/2019, 12:06 PM

## 2019-04-04 NOTE — Progress Notes (Signed)
Physical Therapy Treatment Patient Details Name: Brittany Blackwell MRN: [id number] DOB: 15-Mar-1918 Today's Date: 04/04/2019    History of Present Illness Brittany Blackwell is a 85 y.o. female who presented to the hospital ER through EMS after hitting life alert on 03/31/2019 who was in her usual state of health at home when she was walking in her kitchen and tripped over her shoes causing her to fall onto her right side.  She had sudden onset of right hip pain afterward, nonradiating, worse with movement, no alleviating factors, moderate to severe in intensity.  No head injury neck pain or loss of consciousness.  X-rays show a impacted subcapital femur fracture on the right hip and patient underwent R hip closed reduction with percutaneous pinning  04/01/2019. Relevant PMH includes macular degeneration and glaucoma, HTN, hypothyroidism, osteoporosis.     PT Comments    Pt ready for session.  To edge of bed with increased time and mod a x 1.  Once sitting, she is able to sit with min guard for balance/safety.  Stood with min a x 2 and was able to take slow cautious steps with min/mod a x 1. +1 assist for recliner follow as she fatigues quickly.  Once sitting, requested to use commode.  Medium/large formed BM with ease.  Care provided as appropriate and she transferred to recliner and remained up after.  She is unable to participate in her own personal care as she needs both hands firmly on walker for support.   Follow Up Recommendations  SNF     Equipment Recommendations  Rolling walker with 5" wheels    Recommendations for Other Services       Precautions / Restrictions Precautions Precautions: Fall Restrictions Weight Bearing Restrictions: Yes    Mobility  Bed Mobility Overal bed mobility: Needs Assistance Bed Mobility: Supine to Sit     Supine to sit: Mod assist        Transfers Overall transfer level: Needs assistance Equipment used: Rolling walker (2 wheeled) Transfers: Sit  to/from Stand Sit to Stand: Min assist;+2 physical assistance            Ambulation/Gait Ambulation/Gait assistance: Mod assist Gait Distance (Feet): 5 Feet Assistive device: Rolling walker (2 wheeled) Gait Pattern/deviations: Step-to pattern;Decreased step length - right;Decreased step length - left;Trunk flexed Gait velocity: reduced       Stairs             Wheelchair Mobility    Modified Rankin (Stroke Patients Only)       Balance Overall balance assessment: Needs assistance Sitting-balance support: Bilateral upper extremity supported Sitting balance-Leahy Scale: Fair     Standing balance support: Bilateral upper extremity supported Standing balance-Leahy Scale: Poor Standing balance comment: Requires BUE support and min-mod A for standing balance.                             Cognition Arousal/Alertness: Awake/alert Behavior During Therapy: WFL for tasks assessed/performed Overall Cognitive Status: Within Functional Limits for tasks assessed                                        Exercises Other Exercises Other Exercises: commode for medium/large formed BM    General Comments        Pertinent Vitals/Pain Pain Assessment: Faces Faces Pain Scale: Hurts little more Pain Descriptors /  Indicators: Aching;Guarding Pain Intervention(s): Limited activity within patient's tolerance;Monitored during session;Repositioned    Home Living                      Prior Function            PT Goals (current goals can now be found in the care plan section) Progress towards PT goals: Progressing toward goals    Frequency    BID      PT Plan Current plan remains appropriate    Co-evaluation              AM-PAC PT "6 Clicks" Mobility   Outcome Measure  Help needed turning from your back to your side while in a flat bed without using bedrails?: A Lot Help needed moving from lying on your back to sitting on  the side of a flat bed without using bedrails?: A Lot Help needed moving to and from a bed to a chair (including a wheelchair)?: A Lot Help needed standing up from a chair using your arms (e.g., wheelchair or bedside chair)?: A Lot Help needed to walk in hospital room?: A Lot Help needed climbing 3-5 steps with a railing? : A Lot 6 Click Score: 12    End of Session Equipment Utilized During Treatment: Gait belt;Oxygen Activity Tolerance: Patient tolerated treatment well;Patient limited by fatigue;Patient limited by pain Patient left: in chair;with call bell/phone within reach;with chair alarm set Nurse Communication: Mobility status;Other (comment)       Time: 3846-6599 PT Time Calculation (min) (ACUTE ONLY): 27 min  Charges:  $Gait Training: 8-22 mins $Therapeutic Activity: 8-22 mins                    Chesley Noon, PTA 04/04/19, 11:05 AM

## 2019-04-04 NOTE — Progress Notes (Signed)
Patient ID: Brittany Blackwell, female   DOB: 10-24-1918, 85 y.o.   MRN: [id number]  [employer] PROGRESS NOTE  ALYCIA COOPERWOOD JME:[id number] DOB: [birth year] DOA: 03/31/2019 PCP: Maryland Pink, MD  HPI/Subjective: Patient awaiting bed offer to SNF Currently pain under control  Objective: Vitals:   04/04/19 0512 04/04/19 0530  BP: (!) 151/62   Pulse: 68   Resp: 18   Temp: 100.1 F (37.8 C) 98.3 F (36.8 C)  SpO2: 99%     Filed Weights   03/31/19 1910 04/01/19 1212  Weight: 65.8 kg 65.8 kg    ROS: Review of Systems  Constitutional: Negative for chills and fever.  Eyes: Negative for blurred vision.  Respiratory: Negative for cough and shortness of breath.   Cardiovascular: Negative for chest pain.  Gastrointestinal: Negative for abdominal pain, constipation, diarrhea, nausea and vomiting.  Genitourinary: Negative for dysuria.  Musculoskeletal: Positive for joint pain.  Neurological: Negative for dizziness and headaches.   Exam: Physical Exam  Constitutional: She is oriented to person, place, and time.  HENT:  Nose: No mucosal edema.  Mouth/Throat: No oropharyngeal exudate or posterior oropharyngeal edema.  Eyes: Pupils are equal, round, and reactive to light. Conjunctivae, EOM and lids are normal.  Neck: No JVD present. Carotid bruit is not present. No edema present. No thyroid mass and no thyromegaly present.  Patient able to move her neck without a problem.  Cardiovascular: S1 normal and S2 normal. Exam reveals no gallop.  No murmur heard. Pulses:      Dorsalis pedis pulses are 2+ on the right side and 2+ on the left side.  Respiratory: No respiratory distress. She has no wheezes. She has no rhonchi. She has no rales.  GI: Soft. Bowel sounds are normal. There is no abdominal tenderness.  Musculoskeletal:     Right ankle: She exhibits no swelling.     Left ankle: She exhibits no swelling.  Lymphadenopathy:    She has no cervical adenopathy.  Neurological: She is  alert and oriented to person, place, and time. No cranial nerve deficit.  Skin: Skin is warm. No rash noted. Nails show no clubbing.  Psychiatric: She has a normal mood and affect.      Data Reviewed: Basic Metabolic Panel: Recent Labs  Lab 04/01/19 0323 04/02/19 0748  NA 138 139  K 3.8 3.4*  CL 105 107  CO2 22 22  GLUCOSE 110* 112*  BUN 37* 28*  CREATININE 0.91 0.88  CALCIUM 8.8* 8.5*   CBC: Recent Labs  Lab 04/01/19 0323 04/02/19 0748  WBC 9.7 8.7  HGB 11.1* 9.5*  HCT 33.6* 28.6*  MCV 95.2 96.6  PLT 142* 118*     Studies: No results found.  Scheduled Meds: . amLODipine  5 mg Oral Daily  . atenolol  50 mg Oral Daily  . brimonidine  1 drop Both Eyes BID   And  . timolol  1 drop Both Eyes BID  . budesonide  9 mg Oral Daily  . docusate sodium  100 mg Oral BID  . enoxaparin (LOVENOX) injection  30 mg Subcutaneous Q24H  . furosemide  20 mg Oral Daily  . latanoprost  1 drop Both Eyes QHS  . polyethylene glycol  17 g Oral Daily  . thyroid  90 mg Oral Daily   Continuous Infusions:   Assessment/Plan:  1. right hip fracture closed, requiring operative repair.  Status post repair continue pain control and PT evaluation 2. Hypertension.  Continue Norvasc and atenolol blood pressure  stable 3. Hypothyroidism unspecified continue Armour Thyroid 4. Glaucoma unspecified continue eyedrops 5. Disposition patient and family requesting transfer to [facility] , awaiting bed offer, they requested we check coronavirus 19 which was checked yesterday negative  Code Status:     Code Status Orders  (From admission, onward)         Start     Ordered   03/31/19 2255  Full code  Continuous     03/31/19 2255        Code Status History    This patient has a current code status but no historical code status.     Family Communication:  Disposition Plan: Likely out to rehab over the weekend  Consultants:  Orthopedic surgery  Time spent: 27 minutes  Leonardo

## 2019-04-04 NOTE — Progress Notes (Signed)
Patient ID: [name], female   DOB: [birth year], 85 y.o.   MRN: [id number]  [employer] PROGRESS NOTE  RONNELL MAKAREWICZ KWI:[id number] DOB: 12-01-1918 DOA: 03/31/2019 PCP: Maryland Pink, MD  HPI/Subjective: Patient states that she is feeling well pain is under control  Objective: Vitals:   04/04/19 0512 04/04/19 0530  BP: (!) 151/62   Pulse: 68   Resp: 18   Temp: 100.1 F (37.8 C) 98.3 F (36.8 C)  SpO2: 99%     Filed Weights   03/31/19 1910 04/01/19 1212  Weight: 65.8 kg 65.8 kg    ROS: Review of Systems  Constitutional: Negative for chills and fever.  Eyes: Negative for blurred vision.  Respiratory: Negative for cough and shortness of breath.   Cardiovascular: Negative for chest pain.  Gastrointestinal: Negative for abdominal pain, constipation, diarrhea, nausea and vomiting.  Genitourinary: Negative for dysuria.  Musculoskeletal: Positive for joint pain.  Neurological: Negative for dizziness and headaches.   Exam: Physical Exam  Constitutional: She is oriented to person, place, and time.  HENT:  Nose: No mucosal edema.  Mouth/Throat: No oropharyngeal exudate or posterior oropharyngeal edema.  Eyes: Pupils are equal, round, and reactive to light. Conjunctivae, EOM and lids are normal.  Neck: No JVD present. Carotid bruit is not present. No edema present. No thyroid mass and no thyromegaly present.  Patient able to move her neck without a problem.  Cardiovascular: S1 normal and S2 normal. Exam reveals no gallop.  No murmur heard. Pulses:      Dorsalis pedis pulses are 2+ on the right side and 2+ on the left side.  Respiratory: No respiratory distress. She has no wheezes. She has no rhonchi. She has no rales.  GI: Soft. Bowel sounds are normal. There is no abdominal tenderness.  Musculoskeletal:     Right ankle: She exhibits no swelling.     Left ankle: She exhibits no swelling.  Lymphadenopathy:    She has no cervical adenopathy.  Neurological: She is alert  and oriented to person, place, and time. No cranial nerve deficit.  Skin: Skin is warm. No rash noted. Nails show no clubbing.  Psychiatric: She has a normal mood and affect.      Data Reviewed: Basic Metabolic Panel: Recent Labs  Lab 04/01/19 0323 04/02/19 0748  NA 138 139  K 3.8 3.4*  CL 105 107  CO2 22 22  GLUCOSE 110* 112*  BUN 37* 28*  CREATININE 0.91 0.88  CALCIUM 8.8* 8.5*   CBC: Recent Labs  Lab 04/01/19 0323 04/02/19 0748  WBC 9.7 8.7  HGB 11.1* 9.5*  HCT 33.6* 28.6*  MCV 95.2 96.6  PLT 142* 118*     Studies: No results found.  Scheduled Meds: . amLODipine  5 mg Oral Daily  . atenolol  50 mg Oral Daily  . brimonidine  1 drop Both Eyes BID   And  . timolol  1 drop Both Eyes BID  . budesonide  9 mg Oral Daily  . docusate sodium  100 mg Oral BID  . enoxaparin (LOVENOX) injection  30 mg Subcutaneous Q24H  . furosemide  20 mg Oral Daily  . latanoprost  1 drop Both Eyes QHS  . polyethylene glycol  17 g Oral Daily  . thyroid  90 mg Oral Daily   Continuous Infusions:   Assessment/Plan:  1. right hip fracture closed, requiring operative repair.  Status post repair continue pain control and PT evaluation 2. Hypertension.  Continue Norvasc and atenolol blood  pressure stable 3. Hypothyroidism unspecified on Armour Thyroid 4. Glaucoma unspecified continue eyedrops 5. Disposition patient and family requesting transfer to [facility] , awaiting bed offer  Code Status:     Code Status Orders  (From admission, onward)         Start     Ordered   03/31/19 2255  Full code  Continuous     03/31/19 2255        Code Status History    This patient has a current code status but no historical code status.     Family Communication: Spoke with son on the phone Disposition Plan: Likely out to rehab over the weekend  Consultants:  Orthopedic surgery  Time spent: 27 minutes  Jayvin Hurrell [employer]

## 2019-04-04 NOTE — Progress Notes (Signed)
  Subjective:  POD #3 s/p percutaneous fixation of right femoral neck hip fracture.   Patient reports right hip pain as mild.  Patient is up out of bed to a chair eating her lunch.  Objective:   VITALS:   Vitals:   04/03/19 1639 04/03/19 1949 04/04/19 0512 04/04/19 0530  BP: (!) 156/63 (!) 152/58 (!) 151/62   Pulse: 64 66 68   Resp:  17 18   Temp: 98.2 F (36.8 C) 98 F (36.7 C) 100.1 F (37.8 C) 98.3 F (36.8 C)  TempSrc: Oral Oral Axillary   SpO2: 100% 100% 99%   Weight:      Height:        PHYSICAL EXAM: Right lower extremity Neurovascular intact Sensation intact distally Intact pulses distally Dorsiflexion/Plantar flexion intact Incision: scant drainage No cellulitis present Compartment soft  LABS  No results found for this or any previous visit (from the past 24 hour(s)).  No results found.  Assessment/Plan: 3 Days Post-Op   Principal Problem:   Closed right hip fracture (HCC) Active Problems:   BP (high blood pressure)   Degeneration macular   Disease of thyroid gland  Patient doing well postop.  Continue current pain management.  Continue physical therapy.  Patient is weightbearing as tolerated on the right lower extremity.  She is on aspirin 81 mg p.o. daily for DVT prophylaxis per Dr. Harlow Mares.  Follow-up with Dr. Harlow Mares in 2 weeks upon discharge from the hospital.    Thornton Park , MD 04/04/2019, 1:22 PM

## 2019-04-05 DIAGNOSIS — S72001A Fracture of unspecified part of neck of right femur, initial encounter for closed fracture: Secondary | ICD-10-CM | POA: Diagnosis not present

## 2019-04-05 DIAGNOSIS — I959 Hypotension, unspecified: Secondary | ICD-10-CM | POA: Diagnosis not present

## 2019-04-05 DIAGNOSIS — S72012A Unspecified intracapsular fracture of left femur, initial encounter for closed fracture: Secondary | ICD-10-CM | POA: Diagnosis not present

## 2019-04-05 DIAGNOSIS — M2550 Pain in unspecified joint: Secondary | ICD-10-CM | POA: Diagnosis not present

## 2019-04-05 DIAGNOSIS — K219 Gastro-esophageal reflux disease without esophagitis: Secondary | ICD-10-CM | POA: Diagnosis not present

## 2019-04-05 DIAGNOSIS — I10 Essential (primary) hypertension: Secondary | ICD-10-CM | POA: Diagnosis not present

## 2019-04-05 DIAGNOSIS — R52 Pain, unspecified: Secondary | ICD-10-CM | POA: Diagnosis not present

## 2019-04-05 DIAGNOSIS — Z7401 Bed confinement status: Secondary | ICD-10-CM | POA: Diagnosis not present

## 2019-04-05 DIAGNOSIS — R0902 Hypoxemia: Secondary | ICD-10-CM | POA: Diagnosis not present

## 2019-04-05 DIAGNOSIS — S72001D Fracture of unspecified part of neck of right femur, subsequent encounter for closed fracture with routine healing: Secondary | ICD-10-CM | POA: Diagnosis not present

## 2019-04-05 DIAGNOSIS — H409 Unspecified glaucoma: Secondary | ICD-10-CM | POA: Diagnosis not present

## 2019-04-05 DIAGNOSIS — E039 Hypothyroidism, unspecified: Secondary | ICD-10-CM | POA: Diagnosis not present

## 2019-04-05 DIAGNOSIS — Z4789 Encounter for other orthopedic aftercare: Secondary | ICD-10-CM | POA: Diagnosis not present

## 2019-04-05 LAB — POTASSIUM: Potassium: 3.3 mmol/L — ABNORMAL LOW (ref 3.5–5.1)

## 2019-04-05 MED ORDER — POTASSIUM CHLORIDE CRYS ER 20 MEQ PO TBCR
40.0000 meq | EXTENDED_RELEASE_TABLET | ORAL | Status: DC
  Administered 2019-04-05: 40 meq via ORAL
  Filled 2019-04-05 (×2): qty 2

## 2019-04-05 MED ORDER — ALPRAZOLAM 0.25 MG PO TABS: 0.2500 mg | ORAL_TABLET | Freq: Every day | ORAL | 0 refills | Status: DC | PRN

## 2019-04-05 NOTE — TOC Transition Note (Signed)
Transition of Care [facility]) - CM/SW Discharge Note   Patient Details  Name: Brittany Blackwell MRN: [id number] Date of Birth: [birth year]  [facility] CM/SW Contact:  Su Hilt, RN Phone Number: 04/05/2019, 8:59 AM   Clinical Narrative:    Patient to DC via EMS transport to [facility] to hall B, room to be determined Nurse to call EMS for transport, DC forms on chart Nurse to call report to [phone]   Final next level of care: Skilled Nursing Facility([facility]) Barriers to Discharge: Barriers Resolved   Patient Goals and CMS Choice Patient states their goals for this hospitalization and ongoing recovery are:: " I want her to go to rehab" - Son  CMS Medicare.gov Compare Post Acute Care list provided to:: Patient Represenative (must comment)(Son ) Choice offered to / list presented to : Adult Children  Discharge Placement                       Discharge Plan and Services                          Social Determinants of Health (SDOH) Interventions     Readmission Risk Interventions No flowsheet data found.

## 2019-04-05 NOTE — Discharge Summary (Signed)
[place] [facility], 85 y.o., DOB 1918/08/30, MRN [id number]. Admission date: 03/31/2019 Discharge Date 04/05/2019 Primary MD [place] Pink, MD Admitting Physician Lance Coon, MD  Admission Diagnosis  Subcapital fracture of femur, left, closed, initial encounter [facility]) [S72.012A]  Discharge Diagnosis   Principal Problem: Right hip fracture closed status post repair Hypertension Hypothyroidism Glaucoma GERD [facility] Course  Brittany Blackwell  is a 85 y.o. female who presents with chief complaint as above.  Patient had mechanical fall at home this evening with subsequent right hip pain.  Here in the ED she was found on imaging to have a right hip fracture.  Patient was admitted and seen by orthopedic surgery and underwent right hip fracture repair.  Patient is doing well.  The nursing home that she was going to requested COVID-19 test which has been done and is negative. Pt was waiting for bed over weekend now stable for discharge.            Consults  orthopedic surgery  Significant Tests:  See full reports for all details     Dg Chest 1 View  Result Date: 03/31/2019 CLINICAL DATA:  85 year old female with a fall EXAM: CHEST  1 VIEW COMPARISON:  03/24/2010 FINDINGS: Cardiomediastinal silhouette slightly enlarged from the prior, though potentially secondary to lower lung volumes. Interlobular septal thickening. No pneumothorax or pleural effusion. No confluent airspace disease. No displaced fracture. IMPRESSION: Coarsened interstitial markings may reflect chronic changes, though early pulmonary edema not excluded. Electronically Signed   By: Corrie Mckusick D.O.   On: 03/31/2019 19:52   Dg Pelvis 1-2 Views  Result Date: 03/31/2019 CLINICAL DATA:  Recent fall with hip pain, initial encounter EXAM: PELVIS - 1-2 VIEW COMPARISON:  None. FINDINGS: Pelvic ring is intact. Subcapital femoral neck fracture is noted with impaction and  angulation at the fracture site. No other focal abnormality is noted. IMPRESSION: Subcapital femoral neck fracture with impaction. Electronically Signed   By: Inez Catalina M.D.   On: 03/31/2019 19:51   Dg Hip Operative Unilat W Or W/o Pelvis Right  Result Date: 04/01/2019 CLINICAL DATA:  Right hip pinning EXAM: OPERATIVE right HIP (WITH PELVIS IF PERFORMED) 2 VIEWS TECHNIQUE: Fluoroscopic spot image(s) were submitted for interpretation post-operatively. COMPARISON:  03/31/2019 FINDINGS: Two images show placement of 3 Knowles pins for treatment of a femoral neck fracture. No radiographically detectable complication. IMPRESSION: Placement of 3 Knowles pins for treatment of a right femoral neck fracture. Electronically Signed   By: Nelson Chimes M.D.   On: 04/01/2019 14:15   Dg Femur Min 2 Views Right  Result Date: 03/31/2019 CLINICAL DATA:  85 year old female with fall EXAM: RIGHT FEMUR 2 VIEWS COMPARISON:  None. FINDINGS: Nondisplaced subcapital fracture of the right hip. Osteopenia.  Atherosclerosis. IMPRESSION: Nondisplaced subcapital right hip fracture. Electronically Signed   By: Corrie Mckusick D.O.   On: 03/31/2019 19:51       Today   Subjective:   Brittany Blackwell patient denying any complaints Objective:   Blood pressure (!) 149/64, pulse 64, temperature 97.8 F (36.6 C), temperature source Oral, resp. rate 20, height 5\' 2"  (1.575 m), weight 65.8 kg, SpO2 99 %.  .  Intake/Output Summary (Last 24 hours) at 04/05/2019 0833 Last data filed at 04/05/2019 0250 Gross per 24 hour  Intake -  Output 350 ml  Net -350 ml    Exam VITAL SIGNS: Blood pressure (!) 149/64, pulse 64, temperature 97.8 F (36.6  C), temperature source Oral, resp. rate 20, height 5\' 2"  (1.575 m), weight 65.8 kg, SpO2 99 %.  GENERAL:  85 y.o.-year-old patient lying in the bed with no acute distress.  EYES: Pupils equal, round, reactive to light and accommodation. No scleral icterus. Extraocular muscles intact.  HEENT:  Head atraumatic, normocephalic. Oropharynx and nasopharynx clear.  NECK:  Supple, no jugular venous distention. No thyroid enlargement, no tenderness.  LUNGS: Normal breath sounds bilaterally, no wheezing, rales,rhonchi or crepitation. No use of accessory muscles of respiration.  CARDIOVASCULAR: S1, S2 normal. No murmurs, rubs, or gallops.  ABDOMEN: Soft, nontender, nondistended. Bowel sounds present. No organomegaly or mass.  EXTREMITIES: No pedal edema, cyanosis, or clubbing.  NEUROLOGIC: Cranial nerves II through XII are intact. Muscle strength 5/5 in all extremities. Sensation intact. Gait not checked.  PSYCHIATRIC: The patient is alert and oriented x 3.  SKIN: No obvious rash, lesion, or ulcer.   Data Review     CBC w Diff:  Lab Results  Component Value Date   WBC 8.7 04/02/2019   HGB 9.5 (L) 04/02/2019   HCT 28.6 (L) 04/02/2019   PLT 118 (L) 04/02/2019   CMP:  Lab Results  Component Value Date   NA 139 04/02/2019   K 3.3 (L) 04/05/2019   CL 107 04/02/2019   CO2 22 04/02/2019   BUN 28 (H) 04/02/2019   CREATININE 0.88 04/02/2019  .  Micro Results Recent Results (from the past 240 hour(s))  Surgical pcr screen     Status: None   Collection Time: 04/01/19  5:48 AM  Result Value Ref Range Status   MRSA, PCR NEGATIVE NEGATIVE Final   Staphylococcus aureus NEGATIVE NEGATIVE Final    Comment: (NOTE) The Xpert SA Assay (FDA approved for NASAL specimens in patients 22 years of age and older), is one component of a comprehensive surveillance program. It is not intended to diagnose infection nor to guide or monitor treatment. Performed at [facility], [address] [place], [place] [address]   SARS Coronavirus 2 [facility] order, Performed in [facility] hospital lab)     Status: None   Collection Time: 04/03/19 10:25 AM  Result Value Ref Range Status   SARS Coronavirus 2 NEGATIVE NEGATIVE Final    Comment: (NOTE) If result is NEGATIVE SARS-CoV-2 target  nucleic acids are NOT DETECTED. The SARS-CoV-2 RNA is generally detectable in upper and lower  respiratory specimens during the acute phase of infection. The lowest  concentration of SARS-CoV-2 viral copies this assay can detect is 250  copies / mL. A negative result does not preclude SARS-CoV-2 infection  and should not be used as the sole basis for treatment or other  patient management decisions.  A negative result may occur with  improper specimen collection / handling, submission of specimen other  than nasopharyngeal swab, presence of viral mutation(s) within the  areas targeted by this assay, and inadequate number of viral copies  (<250 copies / mL). A negative result must be combined with clinical  observations, patient history, and epidemiological information. If result is POSITIVE SARS-CoV-2 target nucleic acids are DETECTED. The SARS-CoV-2 RNA is generally detectable in upper and lower  respiratory specimens dur ing the acute phase of infection.  Positive  results are indicative of active infection with SARS-CoV-2.  Clinical  correlation with patient history and other diagnostic information is  necessary to determine patient infection status.  Positive results do  not rule out bacterial infection or co-infection with other viruses. If  result is PRESUMPTIVE POSTIVE SARS-CoV-2 nucleic acids MAY BE PRESENT.   A presumptive positive result was obtained on the submitted specimen  and confirmed on repeat testing.  While 2019 novel coronavirus  (SARS-CoV-2) nucleic acids may be present in the submitted sample  additional confirmatory testing may be necessary for epidemiological  and / or clinical management purposes  to differentiate between  SARS-CoV-2 and other Sarbecovirus currently known to infect humans.  If clinically indicated additional testing with an alternate test  methodology [phone]) is advised. The SARS-CoV-2 RNA is generally  detectable in upper and lower  respiratory sp ecimens during the acute  phase of infection. The expected result is Negative. Fact Sheet for Patients:  StrictlyIdeas.no Fact Sheet for Healthcare Providers: BankingDealers.co.za This test is not yet approved or cleared by the [place] FDA and has been authorized for detection and/or diagnosis of SARS-CoV-2 by FDA under an Emergency Use Authorization (EUA).  This EUA will remain in effect (meaning this test can be used) for the duration of the COVID-19 declaration under Section 564(b)(1) of the Act, 21 U.S.C. section 360bbb-3(b)(1), unless the authorization is terminated or revoked sooner. Performed at [facility], [address] [place], [place] [address]         Code Status Orders  (From admission, onward)         Start     Ordered   03/31/19 2255  Full code  Continuous     03/31/19 2255        Code Status History    This patient has a current code status but no historical code status.    Advance Directive Documentation     Most Recent Value  Type of Advance Directive  Healthcare Power of Attorney  Pre-existing out of facility DNR order (yellow form or pink MOST form)  -  "MOST" Form in Place?  -           Contact information for follow-up providers    Lovell Sheehan, MD. Schedule an appointment as soon as possible for a visit in 2 weeks.   Specialty:  Orthopedic Surgery Contact information: [address] [place] [address] [phone]            Contact information for after-discharge care    Destination    [facility] .   Service:  Skilled Nursing Contact information: [address] [place] [phone]                  Discharge Medications   Allergies as of 04/05/2019      Reactions   Demerol [meperidine] Other (See Comments)   BP drops      Medication List    TAKE these  medications   acetaminophen 325 MG tablet Commonly known as:  TYLENOL Take 2 tablets (650 mg total) by mouth every 6 (six) hours as needed for mild pain (or Fever >/= 101).   ALPRAZolam 0.25 MG tablet Commonly known as:  XANAX Take 1 tablet (0.25 mg total) by mouth daily as needed.   amLODipine 5 MG tablet Commonly known as:  NORVASC Take 5 mg by mouth daily.   Armour Thyroid 90 MG tablet Generic drug:  thyroid Take 90 mg by mouth daily.   aspirin EC 81 MG tablet Take 1 tablet (81 mg total) by mouth daily.   atenolol 50 MG tablet Commonly known as:  TENORMIN Take 50 mg by mouth daily.   budesonide  3 MG 24 hr capsule Commonly known as:  ENTOCORT EC Take 3 capsules by mouth daily.   Combigan 0.2-0.5 % ophthalmic solution Generic drug:  brimonidine-timolol Place 1 drop into both eyes every 12 (twelve) hours.   docusate sodium 100 MG capsule Commonly known as:  COLACE Take 1 capsule (100 mg total) by mouth 2 (two) times daily.   furosemide 20 MG tablet Commonly known as:  LASIX Take 20 mg by mouth daily.   latanoprost 0.005 % ophthalmic solution Commonly known as:  XALATAN Place 1 drop into both eyes at bedtime.   oxyCODONE 5 MG immediate release tablet Commonly known as:  Oxy IR/ROXICODONE Take 1 tablet (5 mg total) by mouth every 4 (four) hours as needed for moderate pain.   potassium chloride SA 20 MEQ tablet Commonly known as:  K-DUR Take 20 mEq by mouth 2 (two) times daily.   timolol 0.5 % ophthalmic solution Commonly known as:  TIMOPTIC Place 1 drop into both eyes daily.          Total Time in preparing paper work, data evaluation and todays exam - 48 minutes  Dustin Flock M.D on 04/05/2019 at 8:33 AM [place]  [phone]

## 2019-04-05 NOTE — Care Management Important Message (Signed)
Important Message  Patient Details  Name: Brittany Blackwell MRN: [id number] Date of Birth: Dec 29, 1917   Medicare Important Message Given:  Yes    [name] 04/05/2019, 12:01 PM
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
## 2019-04-13 DIAGNOSIS — E039 Hypothyroidism, unspecified: Secondary | ICD-10-CM | POA: Diagnosis not present

## 2019-04-13 DIAGNOSIS — I10 Essential (primary) hypertension: Secondary | ICD-10-CM | POA: Diagnosis not present

## 2019-04-13 DIAGNOSIS — Z4789 Encounter for other orthopedic aftercare: Secondary | ICD-10-CM | POA: Diagnosis not present

## 2019-04-13 DIAGNOSIS — K219 Gastro-esophageal reflux disease without esophagitis: Secondary | ICD-10-CM | POA: Diagnosis not present

## 2019-04-13 DIAGNOSIS — H409 Unspecified glaucoma: Secondary | ICD-10-CM | POA: Diagnosis not present

## 2019-05-07 DIAGNOSIS — Z7952 Long term (current) use of systemic steroids: Secondary | ICD-10-CM | POA: Diagnosis not present

## 2019-05-07 DIAGNOSIS — Z79899 Other long term (current) drug therapy: Secondary | ICD-10-CM | POA: Diagnosis not present

## 2019-05-07 DIAGNOSIS — Z79891 Long term (current) use of opiate analgesic: Secondary | ICD-10-CM | POA: Diagnosis not present

## 2019-05-07 DIAGNOSIS — K219 Gastro-esophageal reflux disease without esophagitis: Secondary | ICD-10-CM | POA: Diagnosis not present

## 2019-05-07 DIAGNOSIS — S72011D Unspecified intracapsular fracture of right femur, subsequent encounter for closed fracture with routine healing: Secondary | ICD-10-CM | POA: Diagnosis not present

## 2019-05-07 DIAGNOSIS — I10 Essential (primary) hypertension: Secondary | ICD-10-CM | POA: Diagnosis not present

## 2019-05-07 DIAGNOSIS — E876 Hypokalemia: Secondary | ICD-10-CM | POA: Diagnosis not present

## 2019-05-07 DIAGNOSIS — Z9181 History of falling: Secondary | ICD-10-CM | POA: Diagnosis not present

## 2019-05-07 DIAGNOSIS — H409 Unspecified glaucoma: Secondary | ICD-10-CM | POA: Diagnosis not present

## 2019-05-07 DIAGNOSIS — W19XXXD Unspecified fall, subsequent encounter: Secondary | ICD-10-CM | POA: Diagnosis not present

## 2019-05-31 DIAGNOSIS — S72009A Fracture of unspecified part of neck of unspecified femur, initial encounter for closed fracture: Secondary | ICD-10-CM | POA: Diagnosis not present

## 2019-05-31 DIAGNOSIS — E876 Hypokalemia: Secondary | ICD-10-CM | POA: Diagnosis not present

## 2019-05-31 DIAGNOSIS — D649 Anemia, unspecified: Secondary | ICD-10-CM | POA: Diagnosis not present

## 2019-07-05 ENCOUNTER — Ambulatory Visit: Payer: PPO | Admitting: Podiatry

## 2019-07-05 ENCOUNTER — Encounter: Payer: Self-pay | Admitting: Podiatry

## 2019-07-05 VITALS — Temp 98.9°F

## 2019-07-05 DIAGNOSIS — B351 Tinea unguium: Secondary | ICD-10-CM | POA: Diagnosis not present

## 2019-07-05 DIAGNOSIS — M79676 Pain in unspecified toe(s): Secondary | ICD-10-CM | POA: Diagnosis not present

## 2019-07-05 DIAGNOSIS — D649 Anemia, unspecified: Secondary | ICD-10-CM | POA: Diagnosis not present
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
## 2019-07-18 ENCOUNTER — Emergency Department: Payer: PPO

## 2019-07-18 ENCOUNTER — Inpatient Hospital Stay
Admission: EM | Admit: 2019-07-18 | Discharge: 2019-07-20 | DRG: 684 | Disposition: A | Discharge status: Home Health | Payer: PPO | Attending: Internal Medicine | Admitting: Internal Medicine

## 2019-07-18 ENCOUNTER — Other Ambulatory Visit: Payer: Self-pay

## 2019-07-18 DIAGNOSIS — K449 Diaphragmatic hernia without obstruction or gangrene: Secondary | ICD-10-CM | POA: Diagnosis not present

## 2019-07-18 DIAGNOSIS — R000 Tachycardia, unspecified: Secondary | ICD-10-CM | POA: Diagnosis not present

## 2019-07-18 DIAGNOSIS — N179 Acute kidney failure, unspecified: Secondary | ICD-10-CM

## 2019-07-18 DIAGNOSIS — R55 Syncope and collapse: Secondary | ICD-10-CM | POA: Diagnosis not present

## 2019-07-18 DIAGNOSIS — K219 Gastro-esophageal reflux disease without esophagitis: Secondary | ICD-10-CM | POA: Diagnosis present

## 2019-07-18 DIAGNOSIS — N200 Calculus of kidney: Secondary | ICD-10-CM | POA: Diagnosis not present

## 2019-07-18 DIAGNOSIS — Z79899 Other long term (current) drug therapy: Secondary | ICD-10-CM

## 2019-07-18 DIAGNOSIS — Z885 Allergy status to narcotic agent status: Secondary | ICD-10-CM

## 2019-07-18 DIAGNOSIS — Z79890 Hormone replacement therapy (postmenopausal): Secondary | ICD-10-CM

## 2019-07-18 DIAGNOSIS — Z90710 Acquired absence of both cervix and uterus: Secondary | ICD-10-CM

## 2019-07-18 DIAGNOSIS — R52 Pain, unspecified: Secondary | ICD-10-CM | POA: Diagnosis not present

## 2019-07-18 DIAGNOSIS — R531 Weakness: Secondary | ICD-10-CM | POA: Diagnosis not present

## 2019-07-18 DIAGNOSIS — I10 Essential (primary) hypertension: Secondary | ICD-10-CM | POA: Diagnosis not present

## 2019-07-18 DIAGNOSIS — E039 Hypothyroidism, unspecified: Secondary | ICD-10-CM | POA: Diagnosis present

## 2019-07-18 DIAGNOSIS — E860 Dehydration: Secondary | ICD-10-CM | POA: Diagnosis not present

## 2019-07-18 DIAGNOSIS — R9431 Abnormal electrocardiogram [ECG] [EKG]: Secondary | ICD-10-CM | POA: Diagnosis not present

## 2019-07-18 DIAGNOSIS — R1084 Generalized abdominal pain: Secondary | ICD-10-CM | POA: Diagnosis not present

## 2019-07-18 DIAGNOSIS — R0902 Hypoxemia: Secondary | ICD-10-CM | POA: Diagnosis not present

## 2019-07-18 DIAGNOSIS — Z7982 Long term (current) use of aspirin: Secondary | ICD-10-CM | POA: Diagnosis not present

## 2019-07-18 DIAGNOSIS — R112 Nausea with vomiting, unspecified: Secondary | ICD-10-CM | POA: Diagnosis present

## 2019-07-18 DIAGNOSIS — Z7952 Long term (current) use of systemic steroids: Secondary | ICD-10-CM

## 2019-07-18 DIAGNOSIS — R42 Dizziness and giddiness: Secondary | ICD-10-CM

## 2019-07-18 LAB — CBC WITH DIFFERENTIAL/PLATELET
Abs Immature Granulocytes: 0.03 10*3/uL (ref 0.00–0.07)
Basophils Absolute: 0 10*3/uL (ref 0.0–0.1)
Basophils Relative: 0 %
Eosinophils Absolute: 0.1 10*3/uL (ref 0.0–0.5)
Eosinophils Relative: 1 %
HCT: 30.8 % — ABNORMAL LOW (ref 36.0–46.0)
Hemoglobin: 10 g/dL — ABNORMAL LOW (ref 12.0–15.0)
Immature Granulocytes: 1 %
Lymphocytes Relative: 10 %
Lymphs Abs: 0.7 10*3/uL (ref 0.7–4.0)
MCH: 29.5 pg (ref 26.0–34.0)
MCHC: 32.5 g/dL (ref 30.0–36.0)
MCV: 90.9 fL (ref 80.0–100.0)
Monocytes Absolute: 0.6 10*3/uL (ref 0.1–1.0)
Monocytes Relative: 9 %
Neutro Abs: 5.2 10*3/uL (ref 1.7–7.7)
Neutrophils Relative %: 79 %
Platelets: 201 10*3/uL (ref 150–400)
RBC: 3.39 MIL/uL — ABNORMAL LOW (ref 3.87–5.11)
RDW: 14.6 % (ref 11.5–15.5)
WBC: 6.6 10*3/uL (ref 4.0–10.5)
nRBC: 0 % (ref 0.0–0.2)

## 2019-07-18 LAB — COMPREHENSIVE METABOLIC PANEL
ALT: 11 U/L (ref 0–44)
AST: 16 U/L (ref 15–41)
Albumin: 3.6 g/dL (ref 3.5–5.0)
Alkaline Phosphatase: 72 U/L (ref 38–126)
Anion gap: 10 (ref 5–15)
BUN: 31 mg/dL — ABNORMAL HIGH (ref 8–23)
CO2: 21 mmol/L — ABNORMAL LOW (ref 22–32)
Calcium: 8.9 mg/dL (ref 8.9–10.3)
Chloride: 106 mmol/L (ref 98–111)
Creatinine, Ser: 1.13 mg/dL — ABNORMAL HIGH (ref 0.44–1.00)
GFR calc Af Amer: 47 mL/min — ABNORMAL LOW (ref >60)
GFR calc non Af Amer: 40 mL/min — ABNORMAL LOW (ref >60)
Glucose, Bld: 117 mg/dL — ABNORMAL HIGH (ref 70–99)
Potassium: 4.4 mmol/L (ref 3.5–5.1)
Sodium: 137 mmol/L (ref 135–145)
Total Bilirubin: 0.7 mg/dL (ref 0.3–1.2)
Total Protein: 6.9 g/dL (ref 6.5–8.1)

## 2019-07-18 LAB — TYPE AND SCREEN
ABO/RH(D): O POS
Antibody Screen: NEGATIVE

## 2019-07-18 LAB — LIPASE, BLOOD: Lipase: 37 U/L (ref 11–51)

## 2019-07-18 MED ORDER — TIMOLOL MALEATE 0.5 % OP SOLN
1.0000 [drp] | Freq: Every day | OPHTHALMIC | Status: DC
  Administered 2019-07-18 – 2019-07-19 (×2): 1 [drp] via OPHTHALMIC
  Filled 2019-07-18: qty 5

## 2019-07-18 MED ORDER — SODIUM CHLORIDE 0.9 % IV SOLN
Freq: Once | INTRAVENOUS | Status: AC
  Administered 2019-07-18: 19:00:00 via INTRAVENOUS

## 2019-07-18 MED ORDER — AMLODIPINE BESYLATE 5 MG PO TABS
5.0000 mg | ORAL_TABLET | Freq: Every day | ORAL | Status: DC
  Administered 2019-07-18 – 2019-07-20 (×3): 5 mg via ORAL
  Filled 2019-07-18 (×3): qty 1

## 2019-07-18 MED ORDER — SENNA 8.6 MG PO TABS
1.0000 | ORAL_TABLET | Freq: Two times a day (BID) | ORAL | Status: DC
  Administered 2019-07-18 – 2019-07-20 (×3): 8.6 mg via ORAL
  Filled 2019-07-18 (×4): qty 1

## 2019-07-18 MED ORDER — ACETAMINOPHEN 325 MG PO TABS: 650.0000 mg | ORAL_TABLET | Freq: Four times a day (QID) | ORAL | Status: DC | PRN

## 2019-07-18 MED ORDER — ALPRAZOLAM 0.25 MG PO TABS: 0.2500 mg | ORAL_TABLET | Freq: Every evening | ORAL | Status: DC | PRN

## 2019-07-18 MED ORDER — LATANOPROST 0.005 % OP SOLN
1.0000 [drp] | Freq: Every day | OPHTHALMIC | Status: DC
  Administered 2019-07-18 – 2019-07-19 (×2): 1 [drp] via OPHTHALMIC
  Filled 2019-07-18: qty 2.5

## 2019-07-18 MED ORDER — BRIMONIDINE TARTRATE-TIMOLOL 0.2-0.5 % OP SOLN
1.0000 [drp] | Freq: Two times a day (BID) | OPHTHALMIC | Status: DC
  Filled 2019-07-18: qty 5

## 2019-07-18 MED ORDER — ACETAMINOPHEN 650 MG RE SUPP: 650.0000 mg | Freq: Four times a day (QID) | RECTAL | Status: DC | PRN

## 2019-07-18 MED ORDER — IOHEXOL 300 MG/ML  SOLN
75.0000 mL | Freq: Once | INTRAMUSCULAR | Status: AC | PRN
  Administered 2019-07-18: 75 mL via INTRAVENOUS

## 2019-07-18 MED ORDER — ONDANSETRON HCL 4 MG/2ML IJ SOLN: 4.0000 mg | Freq: Four times a day (QID) | INTRAMUSCULAR | Status: DC | PRN

## 2019-07-18 MED ORDER — ONDANSETRON HCL 4 MG PO TABS: 4.0000 mg | ORAL_TABLET | Freq: Four times a day (QID) | ORAL | Status: DC | PRN

## 2019-07-18 MED ORDER — SODIUM CHLORIDE 0.9 % IV SOLN
Freq: Once | INTRAVENOUS | Status: AC
  Administered 2019-07-18: 18:00:00 via INTRAVENOUS

## 2019-07-18 MED ORDER — ASPIRIN EC 81 MG PO TBEC
81.0000 mg | DELAYED_RELEASE_TABLET | Freq: Every day | ORAL | Status: DC
  Administered 2019-07-18 – 2019-07-20 (×3): 81 mg via ORAL
  Filled 2019-07-18 (×3): qty 1

## 2019-07-18 MED ORDER — ATENOLOL 50 MG PO TABS
50.0000 mg | ORAL_TABLET | Freq: Every day | ORAL | Status: DC
  Administered 2019-07-18 – 2019-07-20 (×2): 50 mg via ORAL
  Filled 2019-07-18 (×3): qty 1

## 2019-07-18 MED ORDER — HEPARIN SODIUM (PORCINE) 5000 UNIT/ML IJ SOLN
5000.0000 [IU] | Freq: Three times a day (TID) | INTRAMUSCULAR | Status: DC
  Administered 2019-07-18 – 2019-07-20 (×5): 5000 [IU] via SUBCUTANEOUS
  Filled 2019-07-18 (×6): qty 1

## 2019-07-18 MED ORDER — THYROID 30 MG PO TABS
90.0000 mg | ORAL_TABLET | Freq: Every day | ORAL | Status: DC
  Administered 2019-07-19 – 2019-07-20 (×2): 90 mg via ORAL
  Filled 2019-07-18 (×2): qty 3

## 2019-07-18 MED ORDER — IOHEXOL 240 MG/ML SOLN
50.0000 mL | Freq: Once | INTRAMUSCULAR | Status: AC
  Administered 2019-07-18: 50 mL via ORAL

## 2019-07-18 NOTE — ED Provider Notes (Signed)
[facility] Emergency Department Provider Note   ____________________________________________   None    (approximate)  I have reviewed the triage vital signs and the nursing notes.   HISTORY  Chief Complaint Melena    HPI Brittany Blackwell is a 85 y.o. female patient had a hip replacement and spent 2 weeks in rehab.  She is gone home.  Today she sat down in the toilet reported a large volume of dark blood in her toilet.  She felt woozy and lightheaded afterwards but is feeling normal now.  She also complains of a little bit of lower abdominal pain.  This is just a count of a deep discomfort.  Her heart rate here in the emergency room sitting up is running between 98 and 102.         Past Medical History:  Diagnosis Date  . Acid reflux   . Hypertension   . Thyroid disease     Patient Active Problem List   Diagnosis Date Noted  . Near syncope 07/18/2019  . Closed right hip fracture ([place]) 03/31/2019  . Chicken pox 01/03/2016  . BP (high blood pressure) 01/03/2016  . Degeneration macular 01/03/2016  . Disease of thyroid gland 01/03/2016    Past Surgical History:  Procedure Laterality Date  . ABDOMINAL HYSTERECTOMY    . HIP PINNING,CANNULATED Right 04/01/2019   Procedure: CANNULATED HIP PINNING;  Surgeon: Lovell Sheehan, MD;  Location: [facility] ORS;  Service: Orthopedics;  Laterality: Right;  . TONSILLECTOMY      Prior to Admission medications   Medication Sig Start Date End Date Taking? Authorizing Provider  ALPRAZolam (XANAX) 0.25 MG tablet Take 1 tablet (0.25 mg total) by mouth daily as needed. 04/05/19  Yes Dustin Flock, MD  brimonidine-timolol (COMBIGAN) 0.2-0.5 % ophthalmic solution Combigan 0.2 %-0.5 % eye drops   Yes [provider]  budesonide (ENTOCORT EC) 3 MG 24 hr capsule Take 9 mg by mouth daily.  02/12/19  Yes [provider]  docusate sodium (COLACE) 100 MG capsule Take 1 capsule (100 mg total) by mouth 2 (two)  times daily. 04/03/19  Yes Dustin Flock, MD  furosemide (LASIX) 20 MG tablet Take 20 mg by mouth daily.   Yes [provider]  latanoprost (XALATAN) 0.005 % ophthalmic solution Place 1 drop into both eyes at bedtime.  03/22/19  Yes [provider]  potassium chloride SA (K-DUR,KLOR-CON) 20 MEQ tablet Take 20 mEq by mouth 2 (two) times daily.  02/21/14  Yes [provider]  thyroid (ARMOUR THYROID) 90 MG tablet Take 90 mg by mouth daily.   Yes [provider]  timolol (TIMOPTIC) 0.5 % ophthalmic solution Place 1 drop into both eyes daily.  03/22/19  Yes [provider]  acetaminophen (TYLENOL) 325 MG tablet Take 2 tablets (650 mg total) by mouth every 6 (six) hours as needed for mild pain (or Fever >/= 101). 04/03/19   Dustin Flock, MD  amLODipine (NORVASC) 5 MG tablet Take 5 mg by mouth daily.    [provider]  aspirin EC 81 MG tablet Take 1 tablet (81 mg total) by mouth daily. Patient not taking: Reported on 07/18/2019 04/03/19 06/26/20  Dustin Flock, MD  atenolol (TENORMIN) 50 MG tablet Take 50 mg by mouth daily.    [provider]  oxyCODONE (OXY IR/ROXICODONE) 5 MG immediate release tablet Take 1 tablet (5 mg total) by mouth every 4 (four) hours as needed for moderate pain. Patient not taking: Reported on  07/18/2019 04/03/19   Dustin Flock, MD    Allergies Demerol [meperidine]  Family History  Problem Relation Age of Onset  . Breast cancer Mother   . Heart attack Father     Social History Social History   Tobacco Use  . Smoking status: Never Smoker  . Smokeless tobacco: Never Used  Substance Use Topics  . Alcohol use: No  . Drug use: No    Review of Systems  Constitutional: No fever/chills Eyes: No visual changes. ENT: No sore throat. Cardiovascular: Denies chest pain. Respiratory: Denies shortness of breath. Gastrointestinal: Lower abdominal pain.  No nausea, no vomiting.  No diarrhea.  No  constipation. Genitourinary: Negative for dysuria. Musculoskeletal: Negative for back pain. Skin: Negative for rash. Neurological: Negative for headaches, focal weakness   ____________________________________________   PHYSICAL EXAM:  VITAL SIGNS: ED Triage Vitals  Enc Vitals Group     BP 07/18/19 1410 (!) 160/81     Pulse Rate 07/18/19 1410 87     Resp 07/18/19 1410 19     Temp 07/18/19 1410 (!) 97.5 F (36.4 C)     Temp Source 07/18/19 1410 Oral     SpO2 07/18/19 1410 96 %     Weight 07/18/19 1401 124 lb (56.2 kg)     Height 07/18/19 1401 5\' 5"  (1.651 m)     Head Circumference --      Peak Flow --      Pain Score 07/18/19 1400 8     Pain Loc --      Pain Edu? --      Excl. in [place]? --     Constitutional: Alert and oriented. Well appearing and in no acute distress. Eyes: Conjunctivae are normal.  Head: Atraumatic. Nose: No congestion/rhinnorhea. Mouth/Throat: Mucous membranes are moist.  Oropharynx non-erythematous. Neck: No stridor. Cardiovascular: Normal rate, regular rhythm. Grossly normal heart sounds.  Good peripheral circulation. Respiratory: Normal respiratory effort.  No retractions. Lungs CTAB. Gastrointestinal: Soft tender to palpation throughout the lower abdomen.  No distention. No abdominal bruits.  Rectal: Perianal area looks normal no masses are palpated stool is Hemoccult negative Musculoskeletal: No lower extremity tenderness nor edema.  Neurologic:  Normal speech and language. No gross focal neurologic deficits are appreciated. No gait instability. Skin:  Skin is warm, dry and intact. No rash noted.   ____________________________________________   LABS (all labs ordered are listed, but only abnormal results are displayed)  Labs Reviewed  CBC WITH DIFFERENTIAL/PLATELET - Abnormal; Notable for the following components:      Result Value   RBC 3.39 (*)    Hemoglobin 10.0 (*)    HCT 30.8 (*)    All other components within normal limits   COMPREHENSIVE METABOLIC PANEL - Abnormal; Notable for the following components:   CO2 21 (*)    Glucose, Bld 117 (*)    BUN 31 (*)    Creatinine, Ser 1.13 (*)    GFR calc non Af Amer 40 (*)    GFR calc Af Amer 47 (*)    All other components within normal limits  LIPASE, BLOOD  BASIC METABOLIC PANEL  TYPE AND SCREEN   ____________________________________________  EKG   ____________________________________________  RADIOLOGY  ED MD interpretation  Official radiology report(s): Ct Abdomen Pelvis W Contrast  Result Date: 07/18/2019 CLINICAL DATA:  Pt arrives from home via [facility]. Was DC from rehab 2 weeks ago after R hip surgery. Arrives A&O, lives at home by self. Arrives today for some blood in stool  that she notices today. Was c/o of back and LLQ pain as well. EXAM: CT ABDOMEN AND PELVIS WITH CONTRAST TECHNIQUE: Multidetector CT imaging of the abdomen and pelvis was performed using the standard protocol following bolus administration of intravenous contrast. CONTRAST:  39mL OMNIPAQUE IOHEXOL 300 MG/ML  SOLN COMPARISON:  None. FINDINGS: Lower chest: Moderate to large hiatal hernia.  No acute findings. Hepatobiliary: Liver normal in size and overall attenuation. There are subtle subcentimeter low-density lesions, 2 in the left lobe and 1 in the central right lobe, too small to fully characterize, but likely cysts. No other liver masses or lesions. Gallbladder. No bile duct dilation. Pancreas: Unremarkable. No pancreatic ductal dilatation or surrounding inflammatory changes. Spleen: Normal in size without focal abnormality. Adrenals/Urinary Tract: No adrenal masses. Bilateral renal cortical thinning. Small stone in the upper pole the right kidney. Medial midpole right renal mass measuring 1.1 cm consistent with a cyst. No other masses. No hydronephrosis. Ureters normal in course and in caliber. Bladder is distended but otherwise unremarkable. Stomach/Bowel: Stomach unremarkable other than the  hiatal hernia. Small bowel is normal in caliber. No wall thickening or inflammation. Colon is normal in caliber. No evidence of a mass. No wall thickening or inflammation. No evidence of appendicitis. Vascular/Lymphatic: Aortic atherosclerosis. No aneurysm. No enlarged lymph nodes. Reproductive: Status post hysterectomy. No adnexal masses. Other: No abdominal wall hernia or abnormality. No abdominopelvic ascites. Musculoskeletal: Recent ORIF of the right proximal femur subcapital neck fracture. Orthopedic hardware appears well seated. No new fractures.  No osteoblastic or osteolytic lesions. IMPRESSION: 1. No acute findings within the abdomen or pelvis. No findings to account for the blood in the stool or left lower quadrant pain. 2. Moderate hiatal hernia. 3. Small nonobstructing stone in the upper pole the right kidney. Bilateral renal cortical thinning. Left renal cyst. 4. Aortic atherosclerosis. Electronically Signed   By: Lajean Manes M.D.   On: 07/18/2019 16:44    ____________________________________________   PROCEDURES  Procedure(s) performed (including Critical Care):  Procedures   ____________________________________________   INITIAL IMPRESSION / ASSESSMENT AND PLAN / ED COURSE    AHMAYA OSTERMILLER was evaluated in Emergency Department on 07/18/2019 for the symptoms described in the history of present illness. She was evaluated in the context of the global COVID-19 pandemic, which necessitated consideration that the patient might be at risk for infection with the SARS-CoV-2 virus that causes COVID-19. Institutional protocols and algorithms that pertain to the evaluation of patients at risk for COVID-19 are in a state of rapid change based on information released by regulatory bodies including the CDC and federal and state organizations. These policies and algorithms were followed during the patient's care in the ED.   Patient has acute kidney injury.  She is old I do not want to drop a lot  of fluid on her right away.  I prefer to admit her overnight and hydrate her slowly we will making sure she has no other problems since she had near syncope at home with the stool.  Her stool is Hemoccult negative here.       ____________________________________________   FINAL CLINICAL IMPRESSION(S) / ED DIAGNOSES  Final diagnoses:  Acute kidney injury ([place])  Near syncope     ED Discharge Orders    None       Note:  This document was prepared using Dragon voice recognition software and may include unintentional dictation errors.    Nena Polio, MD 07/18/19 2112

## 2019-07-18 NOTE — Progress Notes (Signed)
Family Meeting Note  Advance Directive no Today a meeting took place with the pt and son. Patient comes in after a near syncopal/dizziness spell after she had vomiting today. She was found to be dehydrated. Lives alone. Recently had a hip fracture got out of rehab three weeks ago. Son in the ER. Discuss code status. Patient wishes to be full code. Time spent 16 minutes   Fritzi Mandes, MD

## 2019-07-18 NOTE — ED Notes (Signed)
Pt in CT. Son remains at bedside.

## 2019-07-18 NOTE — ED Notes (Signed)
Pt finished oral contrast. CT notified. Son remains at bedside.

## 2019-07-18 NOTE — H&P (Signed)
[facility] [place] NAME: Brittany Blackwell    MR#:  [id number]  DATE OF BIRTH:  [birth year]  DATE OF ADMISSION:  07/18/2019  PRIMARY CARE PHYSICIAN: Maryland Pink, MD   REQUESTING/REFERRING PHYSICIAN: Dr. Rip Harbour  CHIEF COMPLAINT:   Nausea vomiting today HISTORY OF PRESENT ILLNESS:  Brittany Blackwell  is a 85 y.o. female with a known history of hypertension, hypothyroidism, recent hip fracture got out of rehab came to the emergency room accompanied by son with nausea and vomiting. Patient had an episode of vomiting today she felt woozy/dizzy thereafter felt like passing out. She did not pass out or lose consciousness. She feels a little better after getting IV fluids in the ER. She appears dehydrated is being admitted for acute renal failure secondary to vomiting.  SOn is present in the ER.  PAST MEDICAL HISTORY:   Past Medical History:  Diagnosis Date  . Acid reflux   . Hypertension   . Thyroid disease     PAST SURGICAL HISTOIRY:   Past Surgical History:  Procedure Laterality Date  . ABDOMINAL HYSTERECTOMY    . HIP PINNING,CANNULATED Right 04/01/2019   Procedure: CANNULATED HIP PINNING;  Surgeon: Lovell Sheehan, MD;  Location: [facility] ORS;  Service: Orthopedics;  Laterality: Right;  . TONSILLECTOMY      SOCIAL HISTORY:   Social History   Tobacco Use  . Smoking status: Never Smoker  . Smokeless tobacco: Never Used  Substance Use Topics  . Alcohol use: No    FAMILY HISTORY:   Family History  Problem Relation Age of Onset  . Breast cancer Mother   . Heart attack Father     DRUG ALLERGIES:   Allergies  Allergen Reactions  . Demerol [Meperidine] Other (See Comments)    BP drops    REVIEW OF SYSTEMS:  Review of Systems  Constitutional: Negative for chills, fever and weight loss.  HENT: Negative for ear discharge, ear pain and nosebleeds.   Eyes: Negative for blurred vision, pain and discharge.  Respiratory: Negative  for sputum production, shortness of breath, wheezing and stridor.   Cardiovascular: Negative for chest pain, palpitations, orthopnea and PND.  Gastrointestinal: Positive for nausea and vomiting. Negative for abdominal pain and diarrhea.  Genitourinary: Negative for frequency and urgency.  Musculoskeletal: Negative for back pain and joint pain.  Neurological: Positive for dizziness and weakness. Negative for sensory change, speech change and focal weakness.  Psychiatric/Behavioral: Negative for depression and hallucinations. The patient is not nervous/anxious.      MEDICATIONS AT HOME:   Prior to Admission medications   Medication Sig Start Date End Date Taking? Authorizing Provider  acetaminophen (TYLENOL) 325 MG tablet Take 2 tablets (650 mg total) by mouth every 6 (six) hours as needed for mild pain (or Fever >/= 101). 04/03/19   Dustin Flock, MD  ALPRAZolam Duanne Moron) 0.25 MG tablet Take 1 tablet (0.25 mg total) by mouth daily as needed. 04/05/19   Dustin Flock, MD  amLODipine (NORVASC) 5 MG tablet Take 5 mg by mouth daily.    [provider]  aspirin EC 81 MG tablet Take 1 tablet (81 mg total) by mouth daily. 04/03/19 06/26/20  Dustin Flock, MD  atenolol (TENORMIN) 50 MG tablet Take 50 mg by mouth daily.    [provider]  brimonidine-timolol (COMBIGAN) 0.2-0.5 % ophthalmic solution Combigan 0.2 %-0.5 % eye drops    [provider]  budesonide (ENTOCORT EC) 3 MG 24 hr capsule Take 9  mg by mouth daily.  02/12/19   [provider]  docusate sodium (COLACE) 100 MG capsule Take 1 capsule (100 mg total) by mouth 2 (two) times daily. 04/03/19   Dustin Flock, MD  furosemide (LASIX) 20 MG tablet Take 20 mg by mouth daily.    [provider]  latanoprost (XALATAN) 0.005 % ophthalmic solution Place 1 drop into both eyes at bedtime.  03/22/19   [provider]  oxyCODONE (OXY IR/ROXICODONE) 5 MG immediate release tablet Take 1 tablet (5 mg  total) by mouth every 4 (four) hours as needed for moderate pain. 04/03/19   Dustin Flock, MD  potassium chloride SA (K-DUR,KLOR-CON) 20 MEQ tablet Take 20 mEq by mouth 2 (two) times daily.  02/21/14   [provider]  thyroid (ARMOUR THYROID) 90 MG tablet Take 90 mg by mouth daily.    [provider]  timolol (TIMOPTIC) 0.5 % ophthalmic solution Place 1 drop into both eyes daily.  03/22/19   [provider]      VITAL SIGNS:  Blood pressure (!) 159/88, pulse 85, temperature (!) 97.5 F (36.4 C), temperature source Oral, resp. rate (!) 21, height 5\' 5"  (1.651 m), weight 56.2 kg, SpO2 97 %.  PHYSICAL EXAMINATION:  GENERAL:  85 y.o.-year-old patient lying in the bed with no acute distress.  EYES: Pupils equal, round, reactive to light and accommodation. No scleral icterus. Extraocular muscles intact.  HEENT: Head atraumatic, normocephalic. Oropharynx and nasopharynx dry.  NECK:  Supple, no jugular venous distention. No thyroid enlargement, no tenderness.  LUNGS: Normal breath sounds bilaterally, no wheezing, rales,rhonchi or crepitation. No use of accessory muscles of respiration.  CARDIOVASCULAR: S1, S2 normal. No murmurs, rubs, or gallops.  ABDOMEN: Soft, nontender, nondistended. Bowel sounds present. No organomegaly or mass.  EXTREMITIES: No pedal edema, cyanosis, or clubbing.  NEUROLOGIC: Cranial nerves II through XII are intact. Muscle strength 5/5 in all extremities. Sensation intact. Gait not checked.  PSYCHIATRIC: The patient is alert and oriented x 3.  SKIN: No obvious rash, lesion, or ulcer.   LABORATORY PANEL:   CBC Recent Labs  Lab 07/18/19 1408  WBC 6.6  HGB 10.0*  HCT 30.8*  PLT 201   ------------------------------------------------------------------------------------------------------------------  Chemistries  Recent Labs  Lab 07/18/19 1408  NA 137  K 4.4  CL 106  CO2 21*  GLUCOSE 117*  BUN 31*  CREATININE 1.13*  CALCIUM 8.9  AST  16  ALT 11  ALKPHOS 72  BILITOT 0.7   ------------------------------------------------------------------------------------------------------------------  Cardiac Enzymes No results for input(s): TROPONINI in the last 168 hours. ------------------------------------------------------------------------------------------------------------------  RADIOLOGY:  Ct Abdomen Pelvis W Contrast  Result Date: 07/18/2019 CLINICAL DATA:  Pt arrives from home via [facility]. Was DC from rehab 2 weeks ago after R hip surgery. Arrives A&O, lives at home by self. Arrives today for some blood in stool that she notices today. Was c/o of back and LLQ pain as well. EXAM: CT ABDOMEN AND PELVIS WITH CONTRAST TECHNIQUE: Multidetector CT imaging of the abdomen and pelvis was performed using the standard protocol following bolus administration of intravenous contrast. CONTRAST:  64mL OMNIPAQUE IOHEXOL 300 MG/ML  SOLN COMPARISON:  None. FINDINGS: Lower chest: Moderate to large hiatal hernia.  No acute findings. Hepatobiliary: Liver normal in size and overall attenuation. There are subtle subcentimeter low-density lesions, 2 in the left lobe and 1 in the central right lobe, too small to fully characterize, but likely cysts. No other liver masses or lesions. Gallbladder. No bile duct dilation. Pancreas:  Unremarkable. No pancreatic ductal dilatation or surrounding inflammatory changes. Spleen: Normal in size without focal abnormality. Adrenals/Urinary Tract: No adrenal masses. Bilateral renal cortical thinning. Small stone in the upper pole the right kidney. Medial midpole right renal mass measuring 1.1 cm consistent with a cyst. No other masses. No hydronephrosis. Ureters normal in course and in caliber. Bladder is distended but otherwise unremarkable. Stomach/Bowel: Stomach unremarkable other than the hiatal hernia. Small bowel is normal in caliber. No wall thickening or inflammation. Colon is normal in caliber. No evidence of a mass. No  wall thickening or inflammation. No evidence of appendicitis. Vascular/Lymphatic: Aortic atherosclerosis. No aneurysm. No enlarged lymph nodes. Reproductive: Status post hysterectomy. No adnexal masses. Other: No abdominal wall hernia or abnormality. No abdominopelvic ascites. Musculoskeletal: Recent ORIF of the right proximal femur subcapital neck fracture. Orthopedic hardware appears well seated. No new fractures.  No osteoblastic or osteolytic lesions. IMPRESSION: 1. No acute findings within the abdomen or pelvis. No findings to account for the blood in the stool or left lower quadrant pain. 2. Moderate hiatal hernia. 3. Small nonobstructing stone in the upper pole the right kidney. Bilateral renal cortical thinning. Left renal cyst. 4. Aortic atherosclerosis. Electronically Signed   By: Lajean Manes M.D.   On: 07/18/2019 16:44    EKG:    IMPRESSION AND PLAN:   Brittany Blackwell  is a 85 y.o. female with a known history of hypertension, hypothyroidism, recent hip fracture got out of rehab came to the emergency room accompanied by son with nausea and vomiting. Patient had an episode of vomiting today she felt woozy/dizzy thereafter felt like passing out. She did not pass out or lose consciousness.  1. Acute renal failure secondary to nausea vomiting -admit for overnight observation -IV fluids -liquid diet advance to regular as tolerated -PRN Zofran  2. Hypertension continue home meds  3. Dizziness with weakness will get physical therapy to see patient patient recently had a hip fracture just got out of rehab three weeks ago  4. Hypothyroidism continue Synthroid  5. DVT prophylaxis subcu heparin  Discussed with patient's son  will have physical therapy see patient. Son is working on getting patient admitted to long-term facility.    All the records are reviewed and case discussed with ED provider.   CODE STATUS: full  TOTAL TIME TAKING CARE OF THIS PATIENT: *45* minutes.    Fritzi Mandes M.D on 07/18/2019 at 6:17 PM  Between 7am to 6pm - Pager - [phone]  After 6pm go to [email or web address] - password [email or web address] [facility]  [facility]  Office  [phone]  CC: Primary care physician; Maryland Pink, MD

## 2019-07-18 NOTE — ED Notes (Signed)
ED TO INPATIENT HANDOFF REPORT  ED Nurse Name and Phone #:  Anda Kraft and [place] Ar [phone]  S Name/Age/Gender Brittany Blackwell 85 y.o. female Room/Bed: ED17A/ED17A  Code Status   Code Status: Prior  Home/SNF/Other Home Patient oriented to: self, place, time and situation Is this baseline? Yes   Triage Complete: Triage complete  Chief Complaint blood in stool  Triage Note Pt arrives from home via [facility]. Was DC from rehab 2 weeks ago after R hip surgery.  Arrives A&O, lives at home by self. Arrives today for some blood in stool that she notices today. Was c/o of back and LLQ pain as well. Arrives with 20 G R wrist. Was ST around 115 with EMS, received 451ml fluid bolus and HR went down to 90. Pt arrived with dried brown stool to bilat feet. Cleaned with wipes at this time.    Allergies Allergies  Allergen Reactions  . Demerol [Meperidine] Other (See Comments)    BP drops    Level of Care/Admitting Diagnosis ED Disposition    ED Disposition Condition Comment   Admit  Hospital Area: [place] [[id number]]  Level of Care: Telemetry [5]  Covid Evaluation: Confirmed COVID Negative  Diagnosis: Near syncope [161096]  Admitting Physician: Fritzi Mandes [email or web address]  Attending Physician: Fritzi Mandes [[id number]]  PT Class (Do Not Modify): Observation [104]  PT Acc Code (Do Not Modify): Observation [10022]       B Medical/Surgery History Past Medical History:  Diagnosis Date  . Acid reflux   . Hypertension   . Thyroid disease    Past Surgical History:  Procedure Laterality Date  . ABDOMINAL HYSTERECTOMY    . HIP PINNING,CANNULATED Right 04/01/2019   Procedure: CANNULATED HIP PINNING;  Surgeon: Lovell Sheehan, MD;  Location: [facility] ORS;  Service: Orthopedics;  Laterality: Right;  . TONSILLECTOMY       A IV Location/Drains/Wounds Patient Lines/Drains/Airways Status   Active Line/Drains/Airways    Name:   Placement date:   Placement time:   Site:   Days:   Peripheral IV  07/18/19 Right Wrist   07/18/19    1403    Wrist   less than 1   Incision (Closed) 04/01/19 Leg Right   04/01/19    1326     108          Intake/Output Last 24 hours No intake or output data in the 24 hours ending 07/18/19 1803  Labs/Imaging Results for orders placed or performed during the hospital encounter of 07/18/19 (from the past 48 hour(s))  CBC with Differential     Status: Abnormal   Collection Time: 07/18/19  2:08 PM  Result Value Ref Range   WBC 6.6 4.0 - 10.5 K/uL   RBC 3.39 (L) 3.87 - 5.11 MIL/uL   Hemoglobin 10.0 (L) 12.0 - 15.0 g/dL   HCT 30.8 (L) 36.0 - 46.0 %   MCV 90.9 80.0 - 100.0 fL   MCH 29.5 26.0 - 34.0 pg   MCHC 32.5 30.0 - 36.0 g/dL   RDW 14.6 11.5 - 15.5 %   Platelets 201 150 - 400 K/uL   nRBC 0.0 0.0 - 0.2 %   Neutrophils Relative % 79 %   Neutro Abs 5.2 1.7 - 7.7 K/uL   Lymphocytes Relative 10 %   Lymphs Abs 0.7 0.7 - 4.0 K/uL   Monocytes Relative 9 %   Monocytes Absolute 0.6 0.1 - 1.0 K/uL   Eosinophils Relative 1 %   Eosinophils Absolute 0.1  0.0 - 0.5 K/uL   Basophils Relative 0 %   Basophils Absolute 0.0 0.0 - 0.1 K/uL   Immature Granulocytes 1 %   Abs Immature Granulocytes 0.03 0.00 - 0.07 K/uL    Comment: Performed at [facility], [address] [place], [place] [address]  Comprehensive metabolic panel     Status: Abnormal   Collection Time: 07/18/19  2:08 PM  Result Value Ref Range   Sodium 137 135 - 145 mmol/L   Potassium 4.4 3.5 - 5.1 mmol/L   Chloride 106 98 - 111 mmol/L   CO2 21 (L) 22 - 32 mmol/L   Glucose, Bld 117 (H) 70 - 99 mg/dL   BUN 31 (H) 8 - 23 mg/dL   Creatinine, Ser 1.13 (H) 0.44 - 1.00 mg/dL   Calcium 8.9 8.9 - 10.3 mg/dL   Total Protein 6.9 6.5 - 8.1 g/dL   Albumin 3.6 3.5 - 5.0 g/dL   AST 16 15 - 41 U/L   ALT 11 0 - 44 U/L   Alkaline Phosphatase 72 38 - 126 U/L   Total Bilirubin 0.7 0.3 - 1.2 mg/dL   GFR calc non Af Amer 40 (L) >60 mL/min   GFR calc Af Amer 47 (L) >60 mL/min   Anion gap 10 5 - 15     Comment: Performed at [facility], [address] [place], [place] [address]  Lipase, blood     Status: None   Collection Time: 07/18/19  2:08 PM  Result Value Ref Range   Lipase 37 11 - 51 U/L    Comment: Performed at [facility], [address] [place], [place] [address]  Type and screen [place]     Status: None   Collection Time: 07/18/19  2:08 PM  Result Value Ref Range   ABO/RH(D) O POS    Antibody Screen NEG    Sample Expiration      07/21/2019,2359 Performed at [facility], [address], [place], [place] [address]    Ct Abdomen Pelvis W Contrast  Result Date: 07/18/2019 CLINICAL DATA:  Pt arrives [facility] home via [facility]. Was DC from rehab 2 weeks ago after R hip surgery. Arrives A&O, lives at home by self. Arrives today for some blood in stool that she notices today. Was c/o of back and LLQ pain as well. EXAM: CT ABDOMEN AND PELVIS WITH CONTRAST TECHNIQUE: Multidetector CT imaging of the abdomen and pelvis was performed using the standard protocol following bolus administration of intravenous contrast. CONTRAST:  96mL OMNIPAQUE IOHEXOL 300 MG/ML  SOLN COMPARISON:  None. FINDINGS: Lower chest: Moderate to large hiatal hernia.  No acute findings. Hepatobiliary: Liver normal in size and overall attenuation. There are subtle subcentimeter low-density lesions, 2 in the left lobe and 1 in the central right lobe, too small to fully characterize, but likely cysts. No other liver masses or lesions. Gallbladder. No bile duct dilation. Pancreas: Unremarkable. No pancreatic ductal dilatation or surrounding inflammatory changes. Spleen: Normal in size without focal abnormality. Adrenals/Urinary Tract: No adrenal masses. Bilateral renal cortical thinning. Small stone in the upper pole the right kidney. Medial midpole right renal mass measuring 1.1 cm consistent with a cyst. No other masses. No hydronephrosis. Ureters normal in course and in  caliber. Bladder is distended but otherwise unremarkable. Stomach/Bowel: Stomach unremarkable other than the hiatal hernia. Small bowel is normal in caliber. No wall thickening or inflammation. Colon is normal in caliber. No evidence of a mass. No  wall thickening or inflammation. No evidence of appendicitis. Vascular/Lymphatic: Aortic atherosclerosis. No aneurysm. No enlarged lymph nodes. Reproductive: Status post hysterectomy. No adnexal masses. Other: No abdominal wall hernia or abnormality. No abdominopelvic ascites. Musculoskeletal: Recent ORIF of the right proximal femur subcapital neck fracture. Orthopedic hardware appears well seated. No new fractures.  No osteoblastic or osteolytic lesions. IMPRESSION: 1. No acute findings within the abdomen or pelvis. No findings to account for the blood in the stool or left lower quadrant pain. 2. Moderate hiatal hernia. 3. Small nonobstructing stone in the upper pole the right kidney. Bilateral renal cortical thinning. Left renal cyst. 4. Aortic atherosclerosis. Electronically Signed   By: Lajean Manes M.D.   On: 07/18/2019 16:44    Pending Labs Unresulted Labs (From admission, onward)    Start     Ordered   Signed and Held  CBC  (heparin)  Once,   R    Comments: Baseline for heparin therapy IF NOT ALREADY DRAWN.  Notify MD if PLT < 100 K.    Signed and Held   Signed and Held  Creatinine, serum  (heparin)  Once,   R    Comments: Baseline for heparin therapy IF NOT ALREADY DRAWN.    Signed and Held   Signed and Held  Basic metabolic panel  Tomorrow morning,   R     Signed and Held          Vitals/Pain Today's Vitals   07/18/19 1530 07/18/19 1545 07/18/19 1600 07/18/19 1747  BP: (!) 162/81 (!) 165/80 (!) 159/88   Pulse: 87 88 95   Resp: 19 (!) 22 16   Temp:      TempSrc:      SpO2: 98% 97% 96%   Weight:      Height:      PainSc:    0-No pain    Isolation Precautions No active isolations  Medications Medications  iohexol (OMNIPAQUE)  240 MG/ML injection 50 mL (50 mLs Oral Contrast Given 07/18/19 1435)  iohexol (OMNIPAQUE) 300 MG/ML solution 75 mL (75 mLs Intravenous Contrast Given 07/18/19 1617)  0.9 %  sodium chloride infusion ( Intravenous New Bag/Given 07/18/19 1746)    Mobility walks with device High fall risk   Focused Assessments    R Recommendations: See Admitting Provider Note  Report given to:   Additional Notes:

## 2019-07-18 NOTE — ED Notes (Signed)
Fecal occult negative 

## 2019-07-18 NOTE — Plan of Care (Signed)
  Problem: Education: Goal: Knowledge of General Education information will improve Description: Including pain rating scale, medication(s)/side effects and non-pharmacologic comfort measures Outcome: Progressing   Problem: Clinical Measurements: Goal: Ability to maintain clinical measurements within normal limits will improve Outcome: Progressing Goal: Diagnostic test results will improve Outcome: Progressing Goal: Respiratory complications will improve Outcome: Progressing Goal: Cardiovascular complication will be avoided Outcome: Progressing   Problem: Activity: Goal: Risk for activity intolerance will decrease Outcome: Progressing   Problem: Nutrition: Goal: Adequate nutrition will be maintained Outcome: Progressing   

## 2019-07-18 NOTE — ED Triage Notes (Addendum)
Pt arrives [facility] home via [facility]. Was DC from rehab 2 weeks ago after R hip surgery.  Arrives A&O, lives at home by self. Arrives today for some blood in stool that she notices today. Was c/o of back and LLQ pain as well. Arrives with 20 G R wrist. Was ST around 115 with EMS, received 41ml fluid bolus and HR went down to 90. Pt arrived with dried brown stool to bilat feet. Cleaned with wipes at this time.

## 2019-07-18 NOTE — ED Notes (Signed)
Attempted to call report. Was never able to get through d/t being on hold for 5 minutes.

## 2019-07-19 ENCOUNTER — Observation Stay
Admit: 2019-07-19 | Discharge: 2019-07-19 | Disposition: A | Discharge status: Home / Self Care | Payer: PPO | Attending: Internal Medicine | Admitting: Internal Medicine

## 2019-07-19 ENCOUNTER — Encounter: Payer: Self-pay | Admitting: *Deleted

## 2019-07-19 DIAGNOSIS — R42 Dizziness and giddiness: Secondary | ICD-10-CM

## 2019-07-19 DIAGNOSIS — R9431 Abnormal electrocardiogram [ECG] [EKG]: Secondary | ICD-10-CM | POA: Diagnosis not present

## 2019-07-19 DIAGNOSIS — E039 Hypothyroidism, unspecified: Secondary | ICD-10-CM | POA: Diagnosis present

## 2019-07-19 DIAGNOSIS — Z79899 Other long term (current) drug therapy: Secondary | ICD-10-CM | POA: Diagnosis not present

## 2019-07-19 DIAGNOSIS — Z885 Allergy status to narcotic agent status: Secondary | ICD-10-CM | POA: Diagnosis not present

## 2019-07-19 DIAGNOSIS — Z90710 Acquired absence of both cervix and uterus: Secondary | ICD-10-CM | POA: Diagnosis not present

## 2019-07-19 DIAGNOSIS — K219 Gastro-esophageal reflux disease without esophagitis: Secondary | ICD-10-CM | POA: Diagnosis present

## 2019-07-19 DIAGNOSIS — R112 Nausea with vomiting, unspecified: Secondary | ICD-10-CM | POA: Diagnosis present

## 2019-07-19 DIAGNOSIS — N179 Acute kidney failure, unspecified: Secondary | ICD-10-CM | POA: Diagnosis present

## 2019-07-19 DIAGNOSIS — Z7982 Long term (current) use of aspirin: Secondary | ICD-10-CM | POA: Diagnosis not present

## 2019-07-19 DIAGNOSIS — Z7952 Long term (current) use of systemic steroids: Secondary | ICD-10-CM | POA: Diagnosis not present

## 2019-07-19 DIAGNOSIS — E860 Dehydration: Secondary | ICD-10-CM | POA: Diagnosis present

## 2019-07-19 DIAGNOSIS — I10 Essential (primary) hypertension: Secondary | ICD-10-CM | POA: Diagnosis present

## 2019-07-19 DIAGNOSIS — Z79890 Hormone replacement therapy (postmenopausal): Secondary | ICD-10-CM | POA: Diagnosis not present

## 2019-07-19 LAB — URINALYSIS, COMPLETE (UACMP) WITH MICROSCOPIC
Bilirubin Urine: NEGATIVE
Glucose, UA: NEGATIVE mg/dL
Hgb urine dipstick: NEGATIVE
Ketones, ur: NEGATIVE mg/dL
Nitrite: NEGATIVE
Protein, ur: NEGATIVE mg/dL
Specific Gravity, Urine: 1.017 (ref 1.005–1.030)
pH: 5 (ref 5.0–8.0)

## 2019-07-19 LAB — CBC
HCT: 33.6 % — ABNORMAL LOW (ref 36.0–46.0)
Hemoglobin: 10.6 g/dL — ABNORMAL LOW (ref 12.0–15.0)
MCH: 28.8 pg (ref 26.0–34.0)
MCHC: 31.5 g/dL (ref 30.0–36.0)
MCV: 91.3 fL (ref 80.0–100.0)
Platelets: 211 10*3/uL (ref 150–400)
RBC: 3.68 MIL/uL — ABNORMAL LOW (ref 3.87–5.11)
RDW: 14.6 % (ref 11.5–15.5)
WBC: 4.9 10*3/uL (ref 4.0–10.5)
nRBC: 0 % (ref 0.0–0.2)

## 2019-07-19 LAB — BASIC METABOLIC PANEL
Anion gap: 7 (ref 5–15)
BUN: 24 mg/dL — ABNORMAL HIGH (ref 8–23)
CO2: 27 mmol/L (ref 22–32)
Calcium: 9 mg/dL (ref 8.9–10.3)
Chloride: 104 mmol/L (ref 98–111)
Creatinine, Ser: 0.91 mg/dL (ref 0.44–1.00)
GFR calc Af Amer: >60 mL/min (ref >60)
GFR calc non Af Amer: 52 mL/min — ABNORMAL LOW (ref >60)
Glucose, Bld: 97 mg/dL (ref 70–99)
Potassium: 4.6 mmol/L (ref 3.5–5.1)
Sodium: 138 mmol/L (ref 135–145)

## 2019-07-19 LAB — ECHOCARDIOGRAM COMPLETE
Height: 63 in
Weight: 1760 oz

## 2019-07-19 MED ORDER — PANTOPRAZOLE SODIUM 40 MG PO TBEC
40.0000 mg | DELAYED_RELEASE_TABLET | Freq: Every day | ORAL | Status: DC
  Administered 2019-07-19 – 2019-07-20 (×2): 40 mg via ORAL
  Filled 2019-07-19 (×2): qty 1

## 2019-07-19 MED ORDER — BRIMONIDINE TARTRATE 0.2 % OP SOLN
1.0000 [drp] | Freq: Two times a day (BID) | OPHTHALMIC | Status: DC
  Administered 2019-07-19 – 2019-07-20 (×3): 1 [drp] via OPHTHALMIC
  Filled 2019-07-19: qty 5

## 2019-07-19 MED ORDER — TIMOLOL MALEATE 0.5 % OP SOLN
1.0000 [drp] | Freq: Two times a day (BID) | OPHTHALMIC | Status: DC
  Administered 2019-07-19 – 2019-07-20 (×3): 1 [drp] via OPHTHALMIC

## 2019-07-19 MED ORDER — SODIUM CHLORIDE 0.9% FLUSH
3.0000 mL | Freq: Two times a day (BID) | INTRAVENOUS | Status: DC
  Administered 2019-07-19 – 2019-07-20 (×3): 3 mL via INTRAVENOUS

## 2019-07-19 MED ORDER — SODIUM CHLORIDE 0.9% FLUSH: 3.0000 mL | INTRAVENOUS | Status: DC | PRN

## 2019-07-19 NOTE — Progress Notes (Signed)
*  PRELIMINARY RESULTS* Echocardiogram 2D Echocardiogram has been performed.  Wallie Char Soffia Doshier 07/19/2019, 11:45 AM

## 2019-07-19 NOTE — Progress Notes (Signed)
[place] [facility] [place] NAME: Brittany Blackwell    MR#:  [id number]  DATE OF BIRTH:  05-27-1918  SUBJECTIVE:  CHIEF COMPLAINT:   Chief Complaint  Patient presents with  . Melena   She had episode of dark brown large amount of stool and then felt dizzy and syncopal so came to emergency room. No new complaints today.  No complaint of abdominal pain or nausea currently. REVIEW OF SYSTEMS:  CONSTITUTIONAL: No fever, fatigue or weakness.  EYES: No blurred or double vision.  EARS, NOSE, AND THROAT: No tinnitus or ear pain.  RESPIRATORY: No cough, shortness of breath, wheezing or hemoptysis.  CARDIOVASCULAR: No chest pain, orthopnea, edema.  GASTROINTESTINAL: No nausea, vomiting, diarrhea or abdominal pain.  GENITOURINARY: No dysuria, hematuria.  ENDOCRINE: No polyuria, nocturia,  HEMATOLOGY: No anemia, easy bruising or bleeding SKIN: No rash or lesion. MUSCULOSKELETAL: No joint pain or arthritis.   NEUROLOGIC: No tingling, numbness, weakness.  PSYCHIATRY: No anxiety or depression.   ROS  DRUG ALLERGIES:   Allergies  Allergen Reactions  . Demerol [Meperidine] Other (See Comments)    BP drops    VITALS:  Blood pressure (!) 143/71, pulse 83, temperature 98.2 F (36.8 C), temperature source Oral, resp. rate 18, height 5\' 3"  (1.6 m), weight 49.9 kg, SpO2 100 %.  PHYSICAL EXAMINATION:  GENERAL:  85 y.o.-year-old patient lying in the bed with no acute distress.  EYES: Pupils equal, round, reactive to light and accommodation. No scleral icterus. Extraocular muscles intact.  HEENT: Head atraumatic, normocephalic. Oropharynx and nasopharynx clear.  NECK:  Supple, no jugular venous distention. No thyroid enlargement, no tenderness.  LUNGS: Normal breath sounds bilaterally, no wheezing, rales,rhonchi or crepitation. No use of accessory muscles of respiration.  CARDIOVASCULAR: S1, S2 normal. No murmurs, rubs, or gallops.  ABDOMEN: Soft, nontender,  nondistended. Bowel sounds present. No organomegaly or mass.  EXTREMITIES: No pedal edema, cyanosis, or clubbing.  NEUROLOGIC: Cranial nerves II through XII are intact. Muscle strength 4/5 in all extremities. Sensation intact. Gait not checked.  PSYCHIATRIC: The patient is alert and oriented x 3.  SKIN: No obvious rash, lesion, or ulcer.   Physical Exam LABORATORY PANEL:   CBC Recent Labs  Lab 07/19/19 1328  WBC 4.9  HGB 10.6*  HCT 33.6*  PLT 211   ------------------------------------------------------------------------------------------------------------------  Chemistries  Recent Labs  Lab 07/18/19 1408 07/19/19 0526  NA 137 138  K 4.4 4.6  CL 106 104  CO2 21* 27  GLUCOSE 117* 97  BUN 31* 24*  CREATININE 1.13* 0.91  CALCIUM 8.9 9.0  AST 16  --   ALT 11  --   ALKPHOS 72  --   BILITOT 0.7  --    ------------------------------------------------------------------------------------------------------------------  Cardiac Enzymes No results for input(s): TROPONINI in the last 168 hours. ------------------------------------------------------------------------------------------------------------------  RADIOLOGY:  Ct Abdomen Pelvis W Contrast  Result Date: 07/18/2019 CLINICAL DATA:  Pt [facility]. Was DC from rehab 2 weeks ago after R hip surgery. Arrives A&O, lives at home by self. Arrives today for some blood in stool that she notices today. Was c/o of back and LLQ pain as well. EXAM: CT ABDOMEN AND PELVIS WITH CONTRAST TECHNIQUE: Multidetector CT imaging of the abdomen and pelvis was performed using the standard protocol following bolus administration of intravenous contrast. CONTRAST:  78mL OMNIPAQUE IOHEXOL 300 MG/ML  SOLN COMPARISON:  None. FINDINGS: Lower chest: Moderate to large hiatal hernia.  No acute findings. Hepatobiliary: Liver normal  in size and overall attenuation. There are subtle subcentimeter low-density lesions, 2 in the left lobe and 1 in  the central right lobe, too small to fully characterize, but likely cysts. No other liver masses or lesions. Gallbladder. No bile duct dilation. Pancreas: Unremarkable. No pancreatic ductal dilatation or surrounding inflammatory changes. Spleen: Normal in size without focal abnormality. Adrenals/Urinary Tract: No adrenal masses. Bilateral renal cortical thinning. Small stone in the upper pole the right kidney. Medial midpole right renal mass measuring 1.1 cm consistent with a cyst. No other masses. No hydronephrosis. Ureters normal in course and in caliber. Bladder is distended but otherwise unremarkable. Stomach/Bowel: Stomach unremarkable other than the hiatal hernia. Small bowel is normal in caliber. No wall thickening or inflammation. Colon is normal in caliber. No evidence of a mass. No wall thickening or inflammation. No evidence of appendicitis. Vascular/Lymphatic: Aortic atherosclerosis. No aneurysm. No enlarged lymph nodes. Reproductive: Status post hysterectomy. No adnexal masses. Other: No abdominal wall hernia or abnormality. No abdominopelvic ascites. Musculoskeletal: Recent ORIF of the right proximal femur subcapital neck fracture. Orthopedic hardware appears well seated. No new fractures.  No osteoblastic or osteolytic lesions. IMPRESSION: 1. No acute findings within the abdomen or pelvis. No findings to account for the blood in the stool or left lower quadrant pain. 2. Moderate hiatal hernia. 3. Small nonobstructing stone in the upper pole the right kidney. Bilateral renal cortical thinning. Left renal cyst. 4. Aortic atherosclerosis. Electronically Signed   By: Lajean Manes M.D.   On: 07/18/2019 16:44    ASSESSMENT AND PLAN:   Active Problems:   Near syncope   Dizziness   Brittany Blackwell  is a 85 y.o. female with a known history of hypertension, hypothyroidism, recent hip fracture got out of rehab came to the emergency room accompanied by son with nausea and vomiting. Patient had an episode  of vomiting today she felt woozy/dizzy thereafter felt like passing out. She did not pass out or lose consciousness.  1. Acute renal failure secondary to nausea vomiting -IV fluids -liquid diet advance to regular as tolerated -PRN Zofran - much improved.  2. Hypertension continue home meds  3. Dizziness with weakness  get physical therapy to see patient patient recently had a hip fracture just got out of rehab three weeks ago - feels much better now.  4. Hypothyroidism continue Synthroid  5. DVT prophylaxis subcu heparin  6. malena- one episode before coming to ER> Hb stable.    Monitor, Start Protonix Oral.  All the records are reviewed and case discussed with Care Management/Social Workerr. Management plans discussed with the patient, family and they are in agreement.  CODE STATUS: Full.  TOTAL TIME TAKING CARE OF THIS PATIENT: 35 minutes.     POSSIBLE D/C IN 1-2 DAYS, DEPENDING ON CLINICAL CONDITION.   Brittany Blackwell M.D on 07/19/2019   Between 7am to 6pm - Pager - [phone]  After 6pm go to [email or web address] - password [email or web address] [facility]  Office  [phone]  CC: Primary care physician; [place] Pink, MD  Note: This dictation was prepared with Dragon dictation along with smaller phrase technology. Any transcriptional errors that result from this process are unintentional.

## 2019-07-19 NOTE — Progress Notes (Signed)
   07/18/19 2020  Clinical Encounter Type  Visited With Patient  Visit Type Initial  Referral From Nurse  Consult/Referral To Chaplain  Spiritual Encounters  Spiritual Needs Prayer;Emotional  Patient was alert and awake upon entry. Patient was talkative, pleasant and religious. [place] provided pastoral care through building rapport. [place] prayed with patient upon request. Pastoral visit was appreciated. No further action required at this time.

## 2019-07-19 NOTE — Evaluation (Signed)
Physical Therapy Evaluation Patient Details Name: Brittany Blackwell MRN: [id number] DOB: 1918-08-24 Today's Date: 07/19/2019   History of Present Illness  Brittany Blackwell is a 70yoF who comes to [facility] after sudden dizziness, N/V and feeling near syncopal- pt admitted with near syncope, dehydration. PMH: HTN, osteoporosis, macular degeneration, Right hip Fracture s/p ORIF (April 2020). After ORIF, pt was at rehab, DC about [place], stayed with her granddaughter for a while then recent transition back to her home.  Clinical Impression  Pt admitted with above diagnosis. Pt currently with functional limitations due to the deficits listed below (see "PT Problem List"). Upon entry, pt in bed, awake and agreeable to participate. The pt is alert and oriented x4, pleasant, conversational, and generally a good historian. Heavy effort for all mobility, but performed with confidence and without need for physical assistance and without gait instability or LOB. Functional mobility assessment demonstrates increased effort/time requirements, fair tolerance, whereas the patient performed these at a higher level of independence PTA. Pt is most limited by bilat flank pain, but has no dizziness in session. Pt unable to say what her DC locale will be. She only just recently returned to her home alone, and was previously living with her grandmother for several weeks after leaving STR. She does not wish to go back to GDTR's home. She reports son wishes for her to become a resident at [facility], but unclear if this is in process or not. No STR stay is needed at this time. PT would be safe at home alone with intermittent support from family.  Pt will benefit from skilled PT intervention to increase independence and safety with basic mobility in preparation for discharge to the venue listed below.       Follow Up Recommendations Home health PT;Supervision - Intermittent    Equipment Recommendations  None recommended by PT     Recommendations for Other Services       Precautions / Restrictions Precautions Precautions: Fall Restrictions Weight Bearing Restrictions: No      Mobility  Bed Mobility Overal bed mobility: Modified Independent                Transfers Overall transfer level: Modified independent Equipment used: Rolling walker (2 wheeled) Transfers: Sit to/from Stand Sit to Stand: Supervision         General transfer comment: weak but fluent motor planning  Ambulation/Gait Ambulation/Gait assistance: Min guard Gait Distance (Feet): 190 Feet Assistive device: Rolling walker (2 wheeled) Gait Pattern/deviations: Step-to pattern Gait velocity: 0.63m/s      Stairs            Wheelchair Mobility    Modified Rankin (Stroke Patients Only)       Balance Overall balance assessment: Modified Independent;No apparent balance deficits (not formally assessed)                                           Pertinent Vitals/Pain Pain Assessment: Faces Faces Pain Scale: Hurts little more Pain Location: bilat flanks, near kidneys Pain Descriptors / Indicators: Aching Pain Intervention(s): Limited activity within patient's tolerance;Monitored during session;Premedicated before session    Home Living Family/patient expects to be discharged to:: Private residence Living Arrangements: Alone Available Help at Discharge: Available PRN/intermittently;Family Type of Home: House Home Access: Stairs to enter Entrance Stairs-Rails: Left;Right;Can reach both Technical brewer of Steps: 3 Home Layout: One level Home Equipment: Gilford Rile -  4 wheels;Cane - single point;Bedside commode;Shower seat - built Investment banker, operational      Prior Function Level of Independence: Needs assistance(DC from STR to granddaughters home.)   Gait / Transfers Assistance Needed: RW for household to limited community AMB; 1 fall in past 16 months.  ADL's / Homemaking Assistance Needed:  modI ADL; help with IADL from Son and [place]        Extremity/Trunk Assessment   Upper Extremity Assessment Upper Extremity Assessment: Overall WFL for tasks assessed;Generalized weakness    Lower Extremity Assessment Lower Extremity Assessment: Overall WFL for tasks assessed;Generalized weakness    Cervical / Trunk Assessment Cervical / Trunk Assessment: Kyphotic  Communication   Communication: No difficulties  Cognition Arousal/Alertness: Awake/alert Behavior During Therapy: WFL for tasks assessed/performed Overall Cognitive Status: Within Functional Limits for tasks assessed                                        General Comments      Exercises     Assessment/Plan    PT Assessment Patient needs continued PT services  PT Problem List Decreased strength;Decreased activity tolerance;Decreased mobility       PT Treatment Interventions Gait training;Balance training;Functional mobility training;Therapeutic activities;Therapeutic exercise    PT Goals (Current goals can be found in the Care Plan section)  Acute Rehab PT Goals Patient Stated Goal: Pt wants to go home, but son wants her to become a resident at [facility]. PT Goal Formulation: With patient Time For Goal Achievement: 08/02/19 Potential to Achieve Goals: Fair    Frequency Min 2X/week   Barriers to discharge        Co-evaluation               AM-PAC PT "6 Clicks" Mobility  Outcome Measure Help needed turning from your back to your side while in a flat bed without using bedrails?: None Help needed moving from lying on your back to sitting on the side of a flat bed without using bedrails?: None Help needed moving to and from a bed to a chair (including a wheelchair)?: A Little Help needed standing up from a chair using your arms (e.g., wheelchair or bedside chair)?: A Little Help needed to walk in hospital room?: A Little Help needed climbing 3-5 steps  with a railing? : A Lot 6 Click Score: 19    End of Session Equipment Utilized During Treatment: Gait belt Activity Tolerance: Patient tolerated treatment well;No increased pain;Patient limited by pain(bilat flank pain, acute x2D) Patient left: in chair;with chair alarm set;with call bell/phone within reach Nurse Communication: Mobility status PT Visit Diagnosis: Other abnormalities of gait and mobility (R26.89);Muscle weakness (generalized) (M62.81);Difficulty in walking, not elsewhere classified (R26.2)    Time: 1136-1203 PT Time Calculation (min) (ACUTE ONLY): 27 min   Charges:   PT Evaluation $PT Eval Low Complexity: 1 Low PT Treatments $Therapeutic Exercise: 8-22 mins        12:38 PM, 07/19/19 Etta Grandchild, PT, DPT Physical Therapist - [facility]  [phone] ([place])   Laddonia C 07/19/2019, 12:34 PM

## 2019-07-20 LAB — CBC
HCT: 31.5 % — ABNORMAL LOW (ref 36.0–46.0)
Hemoglobin: 10.3 g/dL — ABNORMAL LOW (ref 12.0–15.0)
MCH: 29.4 pg (ref 26.0–34.0)
MCHC: 32.7 g/dL (ref 30.0–36.0)
MCV: 90 fL (ref 80.0–100.0)
Platelets: 227 10*3/uL (ref 150–400)
RBC: 3.5 MIL/uL — ABNORMAL LOW (ref 3.87–5.11)
RDW: 14.6 % (ref 11.5–15.5)
WBC: 5.3 10*3/uL (ref 4.0–10.5)
nRBC: 0 % (ref 0.0–0.2)

## 2019-07-20 LAB — CREATININE, SERUM
Creatinine, Ser: 0.85 mg/dL (ref 0.44–1.00)
GFR calc Af Amer: >60 mL/min (ref >60)
GFR calc non Af Amer: 57 mL/min — ABNORMAL LOW (ref >60)

## 2019-07-20 MED ORDER — PANTOPRAZOLE SODIUM 40 MG PO TBEC: 40.0000 mg | DELAYED_RELEASE_TABLET | Freq: Every day | ORAL | 0 refills | Status: AC

## 2019-07-20 NOTE — TOC Transition Note (Signed)
Transition of Care [facility]) - CM/SW Discharge Note   Patient Details  Name: Brittany Blackwell MRN: [id number] Date of Birth: 1918/07/28  Transition of Care [facility]) CM/SW Contact:  Ross Ludwig, LCSW Phone Number: 07/20/2019, 6:56 PM   Clinical Narrative:     Patient is a 85 year old female who is alert and oriented x3.  Patient lives alone and has some caregivers that check in on her throughout the day.  Patient's family live close by as well, patient is not currently active with a home health agency, CSW spoke with patient's son, and he does not have a preference for home health agency.  CSW contacted [place] at [facility] who said she can accept patient.  Patient's son stated he did not need any other equipment for patient she has a walker at home, and the toilet is very close to where patient usually sits.   Final next level of care: [place] Barriers to Discharge: Barriers Resolved   Patient Goals and CMS Choice Patient states their goals for this hospitalization and ongoing recovery are:: To return back home with home health. CMS Medicare.gov Compare Post Acute Care list provided to:: Patient Represenative (must comment) Choice offered to / list presented to : Adult Children  Discharge Placement    Patient discharging back home with home health.                   Discharge Plan and Services                DME Arranged: N/A DME Agency: NA       HH Arranged: RN, PT, OT, Nurse's Aide [place] Agency: [facility] Date [place] Agency Contacted: 07/20/19 Time [place]: 0802 Representative spoke with at [place]: [place]  Social Determinants of Health ([place]) Interventions     Readmission Risk Interventions No flowsheet data found.

## 2019-07-20 NOTE — Plan of Care (Signed)
  Problem: Education: Goal: Knowledge of General Education information will improve Description: Including pain rating scale, medication(s)/side effects and non-pharmacologic comfort measures Outcome: Progressing   Problem: Clinical Measurements: Goal: Respiratory complications will improve Outcome: Progressing Note: On room air   Problem: Activity: Goal: Risk for activity intolerance will decrease Outcome: Progressing Note: Up with 1 assist tolerating well   Problem: Pain Managment: Goal: General experience of comfort will improve Outcome: Progressing   Problem: Safety: Goal: Ability to remain free from injury will improve Outcome: Progressing Note: Low bed in place   Problem: Nutrition: Goal: Adequate nutrition will be maintained Outcome: Completed/Met   Problem: Coping: Goal: Level of anxiety will decrease Outcome: Completed/Met

## 2019-07-20 NOTE — Discharge Summary (Signed)
[place] [facility] [place] NAME: Brittany Blackwell    MR#:  [id number]  DATE OF BIRTH:  October 19, 1918  DATE OF ADMISSION:  07/18/2019 ADMITTING PHYSICIAN: Fritzi Mandes, MD  DATE OF DISCHARGE: 07/20/2019   PRIMARY CARE PHYSICIAN: Maryland Pink, MD    ADMISSION DIAGNOSIS:  blood in stool  DISCHARGE DIAGNOSIS:  Active Problems:   Near syncope   Dizziness   SECONDARY DIAGNOSIS:   Past Medical History:  Diagnosis Date  . Acid reflux   . Hypertension   . Thyroid disease     HOSPITAL COURSE:   MarieWillsis a40 y.o.femalewith a known history of hypertension, hypothyroidism, recent hip fracture got out of rehab came to the emergency room accompanied by son with nausea and vomiting. Patient had an episode of vomiting today she felt woozy/dizzy thereafter felt like passing out. She did not pass out or lose consciousness.  1.Acute renal failure secondary to nausea vomiting -IV fluids -liquid diet advance to regular as tolerated -PRN Zofran - much improved.  2.Hypertension continue home meds  3.Dizziness with weakness  get physical therapy to see patient patient recently had a hip fracture just got out of rehab three weeks ago - feels much better now. - PT suggest HHA.  4.Hypothyroidism continue Synthroid  5.DVT prophylaxis subcu heparin  6. malena- one episode before coming to ER> Hb stable.    Monitor, Start Protonix Oral.   Hb stable, no more complains.    She was taking oral budesonide tabs- cont on discharge. DISCHARGE CONDITIONS:  Stable.  CONSULTS OBTAINED:    DRUG ALLERGIES:   Allergies  Allergen Reactions  . Demerol [Meperidine] Other (See Comments)    BP drops    DISCHARGE MEDICATIONS:   Allergies as of 07/20/2019      Reactions   Demerol [meperidine] Other (See Comments)   BP drops      Medication List    STOP taking these medications   aspirin EC 81 MG tablet   furosemide 20 MG  tablet Commonly known as: LASIX   oxyCODONE 5 MG immediate release tablet Commonly known as: Oxy IR/ROXICODONE   potassium chloride SA 20 MEQ tablet Commonly known as: K-DUR     TAKE these medications   acetaminophen 325 MG tablet Commonly known as: TYLENOL Take 2 tablets (650 mg total) by mouth every 6 (six) hours as needed for mild pain (or Fever >/= 101).   ALPRAZolam 0.25 MG tablet Commonly known as: XANAX Take 1 tablet (0.25 mg total) by mouth daily as needed.   amLODipine 5 MG tablet Commonly known as: NORVASC Take 5 mg by mouth daily.   Armour Thyroid 90 MG tablet Generic drug: thyroid Take 90 mg by mouth daily.   atenolol 50 MG tablet Commonly known as: TENORMIN Take 50 mg by mouth daily.   budesonide 3 MG 24 hr capsule Commonly known as: ENTOCORT EC Take 9 mg by mouth daily.   Combigan 0.2-0.5 % ophthalmic solution Generic drug: brimonidine-timolol Combigan 0.2 %-0.5 % eye drops   docusate sodium 100 MG capsule Commonly known as: COLACE Take 1 capsule (100 mg total) by mouth 2 (two) times daily.   latanoprost 0.005 % ophthalmic solution Commonly known as: XALATAN Place 1 drop into both eyes at bedtime.   pantoprazole 40 MG tablet Commonly known as: PROTONIX Take 1 tablet (40 mg total) by mouth daily. Start taking on: July 21, 2019   timolol 0.5 % ophthalmic solution Commonly known as: Dance movement psychotherapist  1 drop into both eyes daily.        DISCHARGE INSTRUCTIONS:    Follow with PMD in 1-2 weeks.  If you experience worsening of your admission symptoms, develop shortness of breath, life threatening emergency, suicidal or homicidal thoughts you must seek medical attention immediately by calling 911 or calling your MD immediately  if symptoms less severe.  You Must read complete instructions/literature along with all the possible adverse reactions/side effects for all the Medicines you take and that have been prescribed to you. Take any new  Medicines after you have completely understood and accept all the possible adverse reactions/side effects.   Please note  You were cared for by a hospitalist during your hospital stay. If you have any questions about your discharge medications or the care you received while you were in the hospital after you are discharged, you can call the unit and asked to speak with the hospitalist on call if the hospitalist that took care of you is not available. Once you are discharged, your primary care physician will handle any further medical issues. Please note that NO REFILLS for any discharge medications will be authorized once you are discharged, as it is imperative that you return to your primary care physician (or establish a relationship with a primary care physician if you do not have one) for your aftercare needs so that they can reassess your need for medications and monitor your lab values.    Today   CHIEF COMPLAINT:   Chief Complaint  Patient presents with  . Melena    HISTORY OF PRESENT ILLNESS:  Brittany Blackwell  is a 85 y.o. female with a known history of hypertension, hypothyroidism, recent hip fracture got out of rehab came to the emergency room accompanied by son with nausea and vomiting. Patient had an episode of vomiting today she felt woozy/dizzy thereafter felt like passing out. She did not pass out or lose consciousness. She feels a little better after getting IV fluids in the ER. She appears dehydrated is being admitted for acute renal failure secondary to vomiting.  SOn is present in the ER.   VITAL SIGNS:  Blood pressure (!) 148/58, pulse 63, temperature 98.3 F (36.8 C), temperature source Oral, resp. rate 18, height 5\' 3"  (1.6 m), weight 50.1 kg, SpO2 97 %.  I/O:    Intake/Output Summary (Last 24 hours) at 07/20/2019 1204 Last data filed at 07/20/2019 1149 Gross per 24 hour  Intake 483 ml  Output 650 ml  Net -167 ml    PHYSICAL EXAMINATION:   GENERAL:  85  y.o.-year-old patient lying in the bed with no acute distress.  EYES: Pupils equal, round, reactive to light and accommodation. No scleral icterus. Extraocular muscles intact.  HEENT: Head atraumatic, normocephalic. Oropharynx and nasopharynx clear.  NECK:  Supple, no jugular venous distention. No thyroid enlargement, no tenderness.  LUNGS: Normal breath sounds bilaterally, no wheezing, rales,rhonchi or crepitation. No use of accessory muscles of respiration.  CARDIOVASCULAR: S1, S2 normal. No murmurs, rubs, or gallops.  ABDOMEN: Soft, nontender, nondistended. Bowel sounds present. No organomegaly or mass.  EXTREMITIES: No pedal edema, cyanosis, or clubbing.  NEUROLOGIC: Cranial nerves II through XII are intact. Muscle strength 4/5 in all extremities. Sensation intact. Gait not checked.  PSYCHIATRIC: The patient is alert and oriented x 3.  SKIN: No obvious rash, lesion, or ulcer.   DATA REVIEW:   CBC Recent Labs  Lab 07/20/19 0619  WBC 5.3  HGB 10.3*  HCT 31.5*  PLT 227    Chemistries  Recent Labs  Lab 07/18/19 1408 07/19/19 0526 07/20/19 0619  NA 137 138  --   K 4.4 4.6  --   CL 106 104  --   CO2 21* 27  --   GLUCOSE 117* 97  --   BUN 31* 24*  --   CREATININE 1.13* 0.91 0.85  CALCIUM 8.9 9.0  --   AST 16  --   --   ALT 11  --   --   ALKPHOS 72  --   --   BILITOT 0.7  --   --     Cardiac Enzymes No results for input(s): TROPONINI in the last 168 hours.  Microbiology Results  Results for orders placed or performed during the hospital encounter of 03/31/19  Surgical pcr screen     Status: None   Collection Time: 04/01/19  5:48 AM   Specimen: Nasal Mucosa; Nasal Swab  Result Value Ref Range Status   MRSA, PCR NEGATIVE NEGATIVE Final   Staphylococcus aureus NEGATIVE NEGATIVE Final    Comment: (NOTE) The Xpert SA Assay (FDA approved for NASAL specimens in patients 13 years of age and older), is one component of a comprehensive surveillance program. It is not  intended to diagnose infection nor to guide or monitor treatment. Performed at [facility], [address] [place], [place] [address]   SARS Coronavirus 2 [place] order, Performed in [place] hospital lab)     Status: None   Collection Time: 04/03/19 10:25 AM   Specimen: Nasopharyngeal Swab  Result Value Ref Range Status   SARS Coronavirus 2 NEGATIVE NEGATIVE Final    Comment: (NOTE) If result is NEGATIVE SARS-CoV-2 target nucleic acids are NOT DETECTED. The SARS-CoV-2 RNA is generally detectable in upper and lower  respiratory specimens during the acute phase of infection. The lowest  concentration of SARS-CoV-2 viral copies this assay can detect is 250  copies / mL. A negative result does not preclude SARS-CoV-2 infection  and should not be used as the sole basis for treatment or other  patient management decisions.  A negative result may occur with  improper specimen collection / handling, submission of specimen other  than nasopharyngeal swab, presence of viral mutation(s) within the  areas targeted by this assay, and inadequate number of viral copies  (<250 copies / mL). A negative result must be combined with clinical  observations, patient history, and epidemiological information. If result is POSITIVE SARS-CoV-2 target nucleic acids are DETECTED. The SARS-CoV-2 RNA is generally detectable in upper and lower  respiratory specimens dur ing the acute phase of infection.  Positive  results are indicative of active infection with SARS-CoV-2.  Clinical  correlation with patient history and other diagnostic information is  necessary to determine patient infection status.  Positive results do  not rule out bacterial infection or co-infection with other viruses. If result is PRESUMPTIVE POSTIVE SARS-CoV-2 nucleic acids MAY BE PRESENT.   A presumptive positive result was obtained on the submitted specimen  and confirmed on repeat testing.  While 2019 novel  coronavirus  (SARS-CoV-2) nucleic acids may be present in the submitted sample  additional confirmatory testing may be necessary for epidemiological  and / or clinical management purposes  to differentiate between  SARS-CoV-2 and other Sarbecovirus currently known to infect humans.  If clinically indicated additional testing with an alternate test  methodology [phone]) is advised. The SARS-CoV-2 RNA is generally  detectable in upper and lower respiratory  sp ecimens during the acute  phase of infection. The expected result is Negative. Fact Sheet for Patients:  StrictlyIdeas.no Fact Sheet for Healthcare Providers: BankingDealers.co.za This test is not yet approved or cleared by the [place] FDA and has been authorized for detection and/or diagnosis of SARS-CoV-2 by FDA under an Emergency Use Authorization (EUA).  This EUA will remain in effect (meaning this test can be used) for the duration of the COVID-19 declaration under Section 564(b)(1) of the Act, 21 U.S.C. section 360bbb-3(b)(1), unless the authorization is terminated or revoked sooner. Performed at [facility], [address], [place], [place] [address]     RADIOLOGY:  Ct Abdomen Pelvis W Contrast  Result Date: 07/18/2019 CLINICAL DATA:  Pt arrives from home via [facility]. Was DC from rehab 2 weeks ago after R hip surgery. Arrives A&O, lives at home by self. Arrives today for some blood in stool that she notices today. Was c/o of back and LLQ pain as well. EXAM: CT ABDOMEN AND PELVIS WITH CONTRAST TECHNIQUE: Multidetector CT imaging of the abdomen and pelvis was performed using the standard protocol following bolus administration of intravenous contrast. CONTRAST:  59mL OMNIPAQUE IOHEXOL 300 MG/ML  SOLN COMPARISON:  None. FINDINGS: Lower chest: Moderate to large hiatal hernia.  No acute findings. Hepatobiliary: Liver normal in size and overall attenuation. There are subtle  subcentimeter low-density lesions, 2 in the left lobe and 1 in the central right lobe, too small to fully characterize, but likely cysts. No other liver masses or lesions. Gallbladder. No bile duct dilation. Pancreas: Unremarkable. No pancreatic ductal dilatation or surrounding inflammatory changes. Spleen: Normal in size without focal abnormality. Adrenals/Urinary Tract: No adrenal masses. Bilateral renal cortical thinning. Small stone in the upper pole the right kidney. Medial midpole right renal mass measuring 1.1 cm consistent with a cyst. No other masses. No hydronephrosis. Ureters normal in course and in caliber. Bladder is distended but otherwise unremarkable. Stomach/Bowel: Stomach unremarkable other than the hiatal hernia. Small bowel is normal in caliber. No wall thickening or inflammation. Colon is normal in caliber. No evidence of a mass. No wall thickening or inflammation. No evidence of appendicitis. Vascular/Lymphatic: Aortic atherosclerosis. No aneurysm. No enlarged lymph nodes. Reproductive: Status post hysterectomy. No adnexal masses. Other: No abdominal wall hernia or abnormality. No abdominopelvic ascites. Musculoskeletal: Recent ORIF of the right proximal femur subcapital neck fracture. Orthopedic hardware appears well seated. No new fractures.  No osteoblastic or osteolytic lesions. IMPRESSION: 1. No acute findings within the abdomen or pelvis. No findings to account for the blood in the stool or left lower quadrant pain. 2. Moderate hiatal hernia. 3. Small nonobstructing stone in the upper pole the right kidney. Bilateral renal cortical thinning. Left renal cyst. 4. Aortic atherosclerosis. Electronically Signed   By: Lajean Manes M.D.   On: 07/18/2019 16:44    EKG:  No orders found for this or any previous visit.    Management plans discussed with the patient, family and they are in agreement.  CODE STATUS: DNR    Code Status Orders  (From admission, onward)         Start      Ordered   07/18/19 1853  Full code  Continuous     07/18/19 1852        Code Status History    Date Active Date Inactive Code Status Order ID Comments User Context   03/31/2019 2255 04/05/2019 1704 Full Code [id number]  Lance Coon, MD ED   Advance Care  Planning Activity    Advance Directive Documentation     Most Recent Value  Type of Advance Directive  Healthcare Power of Attorney  Pre-existing out of facility DNR order (yellow form or pink MOST form)  -  "MOST" Form in Place?  -      TOTAL TIME TAKING CARE OF THIS PATIENT: 35 minutes.  I spoke to son on phone.  Vaughan Basta M.D on 07/20/2019 at 12:04 PM  Between 7am to 6pm - Pager - [phone]  After 6pm go to [email or web address] - password [email or web address] [place] [facility]  Office  [phone]  CC: Primary care physician; [place] Pink, MD   Note: This dictation was prepared with Dragon dictation along with smaller phrase technology. Any transcriptional errors that result from this process are unintentional.

## 2019-07-20 NOTE — Progress Notes (Signed)
Family Meeting Note  Advance Directive:no  Today a meeting took place with the son, POA. Discussed on phone, due to hospitals COVID 19 related " no visitors policy"  The following clinical team members were present during this meeting:MD  The following were discussed:Patient's diagnosis: old age, weakness, Patient's progosis: Unable to determine and Goals for treatment: DNR  Additional follow-up to be provided: PMD  Time spent during discussion:20 minutes  Vaughan Basta, MD

## 2019-07-26 DIAGNOSIS — Z9181 History of falling: Secondary | ICD-10-CM | POA: Diagnosis not present

## 2019-07-26 DIAGNOSIS — E039 Hypothyroidism, unspecified: Secondary | ICD-10-CM | POA: Diagnosis not present

## 2019-07-26 DIAGNOSIS — H3530 Unspecified macular degeneration: Secondary | ICD-10-CM | POA: Diagnosis not present

## 2019-07-26 DIAGNOSIS — Z8781 Personal history of (healed) traumatic fracture: Secondary | ICD-10-CM | POA: Diagnosis not present

## 2019-07-26 DIAGNOSIS — S81802D Unspecified open wound, left lower leg, subsequent encounter: Secondary | ICD-10-CM | POA: Diagnosis not present

## 2019-07-26 DIAGNOSIS — I10 Essential (primary) hypertension: Secondary | ICD-10-CM | POA: Diagnosis not present

## 2019-07-26 DIAGNOSIS — K219 Gastro-esophageal reflux disease without esophagitis: Secondary | ICD-10-CM | POA: Diagnosis not present

## 2019-07-29 ENCOUNTER — Emergency Department
Admission: EM | Admit: 2019-07-29 | Discharge: 2019-07-30 | Disposition: A | Discharge status: Other Acute Inpatient Hospital | Payer: PPO | Attending: Emergency Medicine | Admitting: Emergency Medicine

## 2019-07-29 ENCOUNTER — Encounter: Payer: Self-pay | Admitting: Emergency Medicine

## 2019-07-29 ENCOUNTER — Emergency Department: Payer: PPO

## 2019-07-29 ENCOUNTER — Other Ambulatory Visit: Payer: Self-pay

## 2019-07-29 DIAGNOSIS — M549 Dorsalgia, unspecified: Secondary | ICD-10-CM | POA: Diagnosis not present

## 2019-07-29 DIAGNOSIS — Y929 Unspecified place or not applicable: Secondary | ICD-10-CM | POA: Insufficient documentation

## 2019-07-29 DIAGNOSIS — W19XXXA Unspecified fall, initial encounter: Secondary | ICD-10-CM

## 2019-07-29 DIAGNOSIS — M6281 Muscle weakness (generalized): Secondary | ICD-10-CM | POA: Diagnosis not present

## 2019-07-29 DIAGNOSIS — Z20828 Contact with and (suspected) exposure to other viral communicable diseases: Secondary | ICD-10-CM | POA: Diagnosis not present

## 2019-07-29 DIAGNOSIS — I10 Essential (primary) hypertension: Secondary | ICD-10-CM | POA: Insufficient documentation

## 2019-07-29 DIAGNOSIS — Z79899 Other long term (current) drug therapy: Secondary | ICD-10-CM | POA: Diagnosis not present

## 2019-07-29 DIAGNOSIS — Y939 Activity, unspecified: Secondary | ICD-10-CM | POA: Insufficient documentation

## 2019-07-29 DIAGNOSIS — R079 Chest pain, unspecified: Secondary | ICD-10-CM | POA: Diagnosis not present

## 2019-07-29 DIAGNOSIS — S0990XA Unspecified injury of head, initial encounter: Secondary | ICD-10-CM | POA: Insufficient documentation

## 2019-07-29 DIAGNOSIS — Y999 Unspecified external cause status: Secondary | ICD-10-CM | POA: Diagnosis not present

## 2019-07-29 DIAGNOSIS — R0902 Hypoxemia: Secondary | ICD-10-CM | POA: Diagnosis not present

## 2019-07-29 DIAGNOSIS — W06XXXA Fall from bed, initial encounter: Secondary | ICD-10-CM | POA: Diagnosis not present

## 2019-07-29 LAB — CBC
HCT: 32.5 % — ABNORMAL LOW (ref 36.0–46.0)
Hemoglobin: 10.6 g/dL — ABNORMAL LOW (ref 12.0–15.0)
MCH: 29.1 pg (ref 26.0–34.0)
MCHC: 32.6 g/dL (ref 30.0–36.0)
MCV: 89.3 fL (ref 80.0–100.0)
Platelets: 271 10*3/uL (ref 150–400)
RBC: 3.64 MIL/uL — ABNORMAL LOW (ref 3.87–5.11)
RDW: 14.7 % (ref 11.5–15.5)
WBC: 7 10*3/uL (ref 4.0–10.5)
nRBC: 0 % (ref 0.0–0.2)

## 2019-07-29 LAB — URINALYSIS, COMPLETE (UACMP) WITH MICROSCOPIC
Bacteria, UA: NONE SEEN
Bilirubin Urine: NEGATIVE
Glucose, UA: NEGATIVE mg/dL
Hgb urine dipstick: NEGATIVE
Ketones, ur: 5 mg/dL — AB
Leukocytes,Ua: NEGATIVE
Nitrite: NEGATIVE
Protein, ur: NEGATIVE mg/dL
Specific Gravity, Urine: 1.01 (ref 1.005–1.030)
WBC, UA: NONE SEEN WBC/hpf (ref 0–5)
pH: 8 (ref 5.0–8.0)

## 2019-07-29 LAB — SARS CORONAVIRUS 2 BY RT PCR (HOSPITAL ORDER, PERFORMED IN ~~LOC~~ HOSPITAL LAB): SARS Coronavirus 2: NEGATIVE

## 2019-07-29 LAB — COMPREHENSIVE METABOLIC PANEL
ALT: 10 U/L (ref 0–44)
AST: 22 U/L (ref 15–41)
Albumin: 3.5 g/dL (ref 3.5–5.0)
Alkaline Phosphatase: 80 U/L (ref 38–126)
Anion gap: 9 (ref 5–15)
BUN: 24 mg/dL — ABNORMAL HIGH (ref 8–23)
CO2: 26 mmol/L (ref 22–32)
Calcium: 9.6 mg/dL (ref 8.9–10.3)
Chloride: 102 mmol/L (ref 98–111)
Creatinine, Ser: 0.87 mg/dL (ref 0.44–1.00)
GFR calc Af Amer: >60 mL/min (ref >60)
GFR calc non Af Amer: 55 mL/min — ABNORMAL LOW (ref >60)
Glucose, Bld: 99 mg/dL (ref 70–99)
Potassium: 4.2 mmol/L (ref 3.5–5.1)
Sodium: 137 mmol/L (ref 135–145)
Total Bilirubin: 0.5 mg/dL (ref 0.3–1.2)
Total Protein: 7 g/dL (ref 6.5–8.1)

## 2019-07-29 NOTE — ED Notes (Signed)
Updated Ed (son) on plan for keeping patient in ER overnight and completing rapid covid swab

## 2019-07-29 NOTE — ED Notes (Signed)
Patient given sandwich tray and coffee

## 2019-07-29 NOTE — ED Triage Notes (Signed)
Pt arrived from home via [facility]. Pt states that she fell this morning around 0800, and her son found her with her head under the bed and upright around 1500. Pt is able to stand, and denies loss of consciousness.

## 2019-07-29 NOTE — ED Provider Notes (Signed)
[facility] Emergency Department Provider Note  Time seen: 5:07 PM  I have reviewed the triage vital signs and the nursing notes.   HISTORY  Chief Complaint Fall and Back Pain   HPI Brittany Blackwell is a 85 y.o. female with a past medical history of hypertension, presents to the emergency department after a fall.  According to the patient she had a "spasm" in her abdomen this morning and slipped off of her bed, landed on the ground next to the bed.  Any chest pain or abdominal pain.  States she has been getting these abdominal "spasms" over the past 2 weeks.  Denies any pain currently.  Denies any fever cough or congestion.  Denies any dysuria vomiting or diarrhea.  Overall the patient appears well, no acute distress.   Past Medical History:  Diagnosis Date  . Acid reflux   . Hypertension   . Thyroid disease     Patient Active Problem List   Diagnosis Date Noted  . Dizziness 07/19/2019  . Near syncope 07/18/2019  . Closed right hip fracture ([place]) 03/31/2019  . Chicken pox 01/03/2016  . BP (high blood pressure) 01/03/2016  . Degeneration macular 01/03/2016  . Disease of thyroid gland 01/03/2016    Past Surgical History:  Procedure Laterality Date  . ABDOMINAL HYSTERECTOMY    . HIP PINNING,CANNULATED Right 04/01/2019   Procedure: CANNULATED HIP PINNING;  Surgeon: Lovell Sheehan, MD;  Location: [facility] ORS;  Service: Orthopedics;  Laterality: Right;  . TONSILLECTOMY      Prior to Admission medications   Medication Sig Start Date End Date Taking? Authorizing Provider  acetaminophen (TYLENOL) 325 MG tablet Take 2 tablets (650 mg total) by mouth every 6 (six) hours as needed for mild pain (or Fever >/= 101). 04/03/19   Dustin Flock, MD  ALPRAZolam Duanne Moron) 0.25 MG tablet Take 1 tablet (0.25 mg total) by mouth daily as needed. 04/05/19   Dustin Flock, MD  amLODipine (NORVASC) 5 MG tablet Take 5 mg by mouth daily.    [provider]  atenolol  (TENORMIN) 50 MG tablet Take 50 mg by mouth daily.    [provider]  brimonidine-timolol (COMBIGAN) 0.2-0.5 % ophthalmic solution Combigan 0.2 %-0.5 % eye drops    [provider]  budesonide (ENTOCORT EC) 3 MG 24 hr capsule Take 9 mg by mouth daily.  02/12/19   [provider]  docusate sodium (COLACE) 100 MG capsule Take 1 capsule (100 mg total) by mouth 2 (two) times daily. 04/03/19   Dustin Flock, MD  latanoprost (XALATAN) 0.005 % ophthalmic solution Place 1 drop into both eyes at bedtime.  03/22/19   [provider]  pantoprazole (PROTONIX) 40 MG tablet Take 1 tablet (40 mg total) by mouth daily. 07/21/19   Vaughan Basta, MD  thyroid [facility] THYROID) 90 MG tablet Take 90 mg by mouth daily.    [provider]  timolol (TIMOPTIC) 0.5 % ophthalmic solution Place 1 drop into both eyes daily.  03/22/19   [provider]    Allergies  Allergen Reactions  . Demerol [Meperidine] Other (See Comments)    BP drops    Family History  Problem Relation Age of Onset  . Breast cancer Mother   . Heart attack Father     Social History Social History   Tobacco Use  . Smoking status: Never Smoker  . Smokeless tobacco: Never Used  Substance Use Topics  . Alcohol use: No  . Drug use:  No    Review of Systems Constitutional: Negative for fever. ENT: Negative for recent illness/congestion Cardiovascular: Negative for chest pain. Respiratory: Negative for shortness of breath. Gastrointestinal: Intermittent abdominal spasms over the past 2 weeks.  Negative for vomiting or diarrhea. Genitourinary: Negative for urinary compaints Musculoskeletal: Negative for musculoskeletal complaints Skin: Negative for skin complaints  Neurological: Negative for headache All other ROS negative  ____________________________________________   PHYSICAL EXAM:  VITAL SIGNS: ED Triage Vitals  Enc Vitals Group     BP 07/29/19 1553 (!) 172/86      Pulse Rate 07/29/19 1553 82     Resp 07/29/19 1553 16     Temp 07/29/19 1553 98.4 F (36.9 C)     Temp Source 07/29/19 1553 Oral     SpO2 07/29/19 1553 97 %     Weight 07/29/19 1556 110 lb (49.9 kg)     Height 07/29/19 1556 5\' 3"  (1.6 m)     Head Circumference --      Peak Flow --      Pain Score 07/29/19 1555 0     Pain Loc --      Pain Edu? --      Excl. in [place]? --    Constitutional: Alert and oriented. Well appearing and in no distress. Eyes: Normal exam ENT      Head: Normocephalic and atraumatic.      Mouth/Throat: Mucous membranes are moist. Cardiovascular: Normal rate, regular rhythm. Respiratory: Normal respiratory effort without tachypnea nor retractions. Breath sounds are clear  Gastrointestinal: Soft and nontender. No distention.   Musculoskeletal: Nontender with normal range of motion in all extremities. Neurologic:  Normal speech and language. No gross focal neurologic deficits  Skin:  Skin is warm, dry and intact.  Psychiatric: Mood and affect are normal.  ____________________________________________    RADIOLOGY  IMPRESSION:  1. No acute intracranial abnormality. No skull fracture.  2. Age related atrophy and chronic small vessel ischemia.   ____________________________________________   INITIAL IMPRESSION / ASSESSMENT AND PLAN / ED COURSE  Pertinent labs & imaging results that were available during my care of the patient were reviewed by me and considered in my medical decision making (see chart for details).   Patient presents emergency department for a fall.  Patient lives alone but her son checks on her, found her on the ground next to her bed.  Patient states she had abdominal spasm this morning and slipped off the bed landing on the ground.  Think she hit her head but denies LOC.  Denies any pain at this time.  Overall the patient appears well, reassuring physical exam.  We will check labs, dose a small amount of IV fluids, obtain a head CT and continue  to closely monitor.  Patient agreeable to plan of care.  CT scan is reassuring.  Son has been working on getting the patient a bed at a nursing facility in the area at [facility].  Patient lives alone but he checks on her each day.  As the patient had a fall and was on the ground for a prolonged time he does not believe the patient is necessarily safe to go home.  I discussed with social work they state they could likely get the patient to her nursing facility tomorrow.  We will check a corona swab and continue to closely monitor.  Will attempt to obtain a hospital bed for the patient to stay in the ED overnight till social work and see in the morning.  Brittany Blackwell was evaluated in Emergency Department on 07/29/2019 for the symptoms described in the history of present illness. She was evaluated in the context of the global COVID-19 pandemic, which necessitated consideration that the patient might be at risk for infection with the SARS-CoV-2 virus that causes COVID-19. Institutional protocols and algorithms that pertain to the evaluation of patients at risk for COVID-19 are in a state of rapid change based on information released by regulatory bodies including the CDC and federal and state organizations. These policies and algorithms were followed during the patient's care in the ED.  ____________________________________________   FINAL CLINICAL IMPRESSION(S) / ED DIAGNOSES  Cheral Marker, MD 07/29/19 2018

## 2019-07-29 NOTE — ED Notes (Signed)
Pt moved to a hospital bed.  Placed back on monitor, warm blankets given, bed monitor set and call bell within reach. Pt has no needs at this time.

## 2019-07-30 DIAGNOSIS — Z9181 History of falling: Secondary | ICD-10-CM | POA: Diagnosis not present

## 2019-07-30 DIAGNOSIS — S0990XA Unspecified injury of head, initial encounter: Secondary | ICD-10-CM | POA: Diagnosis not present

## 2019-07-30 DIAGNOSIS — Z8781 Personal history of (healed) traumatic fracture: Secondary | ICD-10-CM | POA: Diagnosis not present

## 2019-07-30 DIAGNOSIS — E039 Hypothyroidism, unspecified: Secondary | ICD-10-CM | POA: Diagnosis not present

## 2019-07-30 DIAGNOSIS — S72002D Fracture of unspecified part of neck of left femur, subsequent encounter for closed fracture with routine healing: Secondary | ICD-10-CM | POA: Diagnosis not present

## 2019-07-30 DIAGNOSIS — K219 Gastro-esophageal reflux disease without esophagitis: Secondary | ICD-10-CM | POA: Diagnosis not present

## 2019-07-30 DIAGNOSIS — S72001D Fracture of unspecified part of neck of right femur, subsequent encounter for closed fracture with routine healing: Secondary | ICD-10-CM | POA: Diagnosis not present

## 2019-07-30 DIAGNOSIS — S81802D Unspecified open wound, left lower leg, subsequent encounter: Secondary | ICD-10-CM | POA: Diagnosis not present

## 2019-07-30 DIAGNOSIS — I10 Essential (primary) hypertension: Secondary | ICD-10-CM | POA: Diagnosis not present

## 2019-07-30 DIAGNOSIS — R2681 Unsteadiness on feet: Secondary | ICD-10-CM | POA: Diagnosis not present

## 2019-07-30 DIAGNOSIS — M6281 Muscle weakness (generalized): Secondary | ICD-10-CM | POA: Diagnosis not present

## 2019-07-30 DIAGNOSIS — M549 Dorsalgia, unspecified: Secondary | ICD-10-CM | POA: Diagnosis not present

## 2019-07-30 DIAGNOSIS — R41841 Cognitive communication deficit: Secondary | ICD-10-CM | POA: Diagnosis not present

## 2019-07-30 DIAGNOSIS — H3530 Unspecified macular degeneration: Secondary | ICD-10-CM | POA: Diagnosis not present

## 2019-07-30 DIAGNOSIS — R278 Other lack of coordination: Secondary | ICD-10-CM | POA: Diagnosis not present

## 2019-07-30 MED ORDER — BUDESONIDE 3 MG PO CPEP
9.0000 mg | ORAL_CAPSULE | Freq: Every day | ORAL | Status: DC
  Administered 2019-07-30: 10:00:00 9 mg via ORAL
  Filled 2019-07-30 (×2): qty 3

## 2019-07-30 MED ORDER — ATENOLOL 25 MG PO TABS
50.0000 mg | ORAL_TABLET | Freq: Every day | ORAL | Status: DC
  Administered 2019-07-30: 10:00:00 50 mg via ORAL
  Filled 2019-07-30: qty 2

## 2019-07-30 MED ORDER — TIMOLOL MALEATE 0.5 % OP SOLN
1.0000 [drp] | Freq: Two times a day (BID) | OPHTHALMIC | Status: DC
  Administered 2019-07-30 (×2): 1 [drp] via OPHTHALMIC
  Filled 2019-07-30: qty 5

## 2019-07-30 MED ORDER — AMLODIPINE BESYLATE 5 MG PO TABS
5.0000 mg | ORAL_TABLET | Freq: Every day | ORAL | Status: DC
  Administered 2019-07-30: 10:00:00 5 mg via ORAL
  Filled 2019-07-30: qty 1

## 2019-07-30 MED ORDER — BRIMONIDINE TARTRATE-TIMOLOL 0.2-0.5 % OP SOLN: 1.0000 [drp] | Freq: Two times a day (BID) | OPHTHALMIC | Status: DC

## 2019-07-30 MED ORDER — PANTOPRAZOLE SODIUM 40 MG PO TBEC
40.0000 mg | DELAYED_RELEASE_TABLET | Freq: Every day | ORAL | Status: DC
  Administered 2019-07-30: 40 mg via ORAL
  Filled 2019-07-30: qty 1

## 2019-07-30 MED ORDER — THYROID 60 MG PO TABS
90.0000 mg | ORAL_TABLET | Freq: Every day | ORAL | Status: DC
  Administered 2019-07-30: 90 mg via ORAL
  Filled 2019-07-30: qty 1

## 2019-07-30 MED ORDER — DOCUSATE SODIUM 100 MG PO CAPS
100.0000 mg | ORAL_CAPSULE | Freq: Two times a day (BID) | ORAL | Status: DC
  Administered 2019-07-30: 100 mg via ORAL
  Filled 2019-07-30: qty 1

## 2019-07-30 MED ORDER — LATANOPROST 0.005 % OP SOLN
1.0000 [drp] | Freq: Every day | OPHTHALMIC | Status: DC
  Filled 2019-07-30: qty 2.5

## 2019-07-30 MED ORDER — BRIMONIDINE TARTRATE 0.2 % OP SOLN
1.0000 [drp] | Freq: Two times a day (BID) | OPHTHALMIC | Status: DC
  Administered 2019-07-30 (×2): 1 [drp] via OPHTHALMIC
  Filled 2019-07-30: qty 5

## 2019-07-30 MED ORDER — CEPHALEXIN 250 MG PO CAPS
250.0000 mg | ORAL_CAPSULE | Freq: Two times a day (BID) | ORAL | Status: DC
  Administered 2019-07-30: 10:00:00 250 mg via ORAL
  Filled 2019-07-30: qty 1

## 2019-07-30 MED ORDER — ACETAMINOPHEN 325 MG PO TABS: 650.0000 mg | ORAL_TABLET | Freq: Four times a day (QID) | ORAL | Status: DC | PRN

## 2019-07-30 NOTE — ED Notes (Signed)
Lunch tray given to pt. Pt eating at this time.

## 2019-07-30 NOTE — Evaluation (Signed)
Physical Therapy Evaluation Patient Details Name: Brittany Blackwell MRN: [id number] DOB: 1918-02-14 Today's Date: 07/30/2019   History of Present Illness  presented to ER secondary to fall OOB (due to abdominal spasm per patient report), unable to self-recover; family concerned with ability to return home alone.  Clinical Impression  Upon evaluation, patient alert and oriented; follows commands and agreeable to participation with session.  Bilat UE/LE strength and ROM grossly symmetrical and WFL for basic transfers and mobility.  Denies acute pain, but does report intermittent 'spasm' of abdomen (reported multiple times, sporadically, throughout session).  Able to complete bed mobility with close sup; sit/stand, basic transfers and gait (200') with RW, cga/close sup.  Decreased cadence and overall gait speed, broad turning radius and decreased dynamic balance reactions noted.  Do recommend continued use of RW and sup for all mobility at this time. Would benefit from skilled PT to address above deficits and promote optimal return to PLOF; Recommend transition to [employer] upon discharge from acute hospitalization.  Per patient, family working toward transition to LTC for patient; would be an appropriate transition for long-term support once arranged.     Follow Up Recommendations Home health PT(per patient, planning for transition to LTC)    Equipment Recommendations       Recommendations for Other Services       Precautions / Restrictions Precautions Precautions: Fall Restrictions Weight Bearing Restrictions: No      Mobility  Bed Mobility Overal bed mobility: Needs Assistance Bed Mobility: Supine to Sit;Sit to Supine     Supine to sit: Supervision Sit to supine: Supervision   General bed mobility comments: increased time/effort for bed mobility tasks  Transfers Overall transfer level: Needs assistance Equipment used: Rolling walker (2 wheeled) Transfers: Sit to/from Stand Sit to  Stand: Min guard         General transfer comment: cuing for hand placement to prevent pulling on RW  Ambulation/Gait Ambulation/Gait assistance: Min guard Gait Distance (Feet): 200 Feet Assistive device: Rolling walker (2 wheeled)       General Gait Details: reciprocal stepping pattern with decreased step height/length, forward flexed posture; broad turning radius with decreased higher level balance reactions.  Reports functional performance near baseline for her  Stairs            Wheelchair Mobility    Modified Rankin (Stroke Patients Only)       Balance Overall balance assessment: Needs assistance Sitting-balance support: No upper extremity supported;Feet supported Sitting balance-Leahy Scale: Good     Standing balance support: Bilateral upper extremity supported Standing balance-Leahy Scale: Fair                               Pertinent Vitals/Pain Pain Assessment: No/denies pain Faces Pain Scale: Hurts a little bit Pain Location: abdominal spasm Pain Descriptors / Indicators: Spasm Pain Intervention(s): Limited activity within patient's tolerance;Monitored during session;Repositioned    Home Living Family/patient expects to be discharged to:: Private residence Living Arrangements: Alone Available Help at Discharge: Available PRN/intermittently;Family Type of Home: House Home Access: Stairs to enter Entrance Stairs-Rails: Left;Right;Can reach both Technical brewer of Steps: 3 Home Layout: One level Home Equipment: [place] - 4 wheels;Cane - single point;Bedside commode;Shower seat - built Investment banker, operational      Prior Function Level of Independence: Independent with assistive device(s)         Comments: Mod indep with 4WRW for ADLs, household mobilization.  Recent admission to STR  after hip fracture (03/2019), discharged to granddaughters home and has returned home alone x3-4 weeks prior to this ER presentation.     Hand  Dominance        Extremity/Trunk Assessment   Upper Extremity Assessment Upper Extremity Assessment: Overall WFL for tasks assessed    Lower Extremity Assessment Lower Extremity Assessment: Overall WFL for tasks assessed(grossly at least 4-/5 throughout bilat LEs)       Communication   Communication: No difficulties  Cognition Arousal/Alertness: Awake/alert Behavior During Therapy: WFL for tasks assessed/performed Overall Cognitive Status: Within Functional Limits for tasks assessed                                        General Comments      Exercises     Assessment/Plan    PT Assessment Patient needs continued PT services  PT Problem List Decreased strength;Decreased activity tolerance;Decreased mobility;Decreased balance       PT Treatment Interventions Gait training;Balance training;Functional mobility training;Therapeutic activities;Therapeutic exercise;DME instruction;Patient/family education;Stair training    PT Goals (Current goals can be found in the Care Plan section)  Acute Rehab PT Goals Patient Stated Goal: planning for transition to LTC at [facility] PT Goal Formulation: With patient Time For Goal Achievement: 08/13/19 Potential to Achieve Goals: Good    Frequency Min 2X/week   Barriers to discharge Decreased caregiver support      Co-evaluation               AM-PAC PT "6 Clicks" Mobility  Outcome Measure Help needed turning from your back to your side while in a flat bed without using bedrails?: None Help needed moving from lying on your back to sitting on the side of a flat bed without using bedrails?: None Help needed moving to and from a bed to a chair (including a wheelchair)?: None Help needed standing up from a chair using your arms (e.g., wheelchair or bedside chair)?: A Little Help needed to walk in hospital room?: A Little Help needed climbing 3-5 steps with a railing? : A Little 6 Click Score: 21    End of  Session Equipment Utilized During Treatment: Gait belt Activity Tolerance: Patient tolerated treatment well Patient left: in chair;with chair alarm set;with call bell/phone within reach Nurse Communication: Mobility status PT Visit Diagnosis: Other abnormalities of gait and mobility (R26.89);Muscle weakness (generalized) (M62.81);Difficulty in walking, not elsewhere classified (R26.2)    Time: 1000-1032 PT Time Calculation (min) (ACUTE ONLY): 32 min   Charges:   PT Evaluation $PT Eval Moderate Complexity: 1 Mod PT Treatments $Gait Training: 8-22 mins        Marysa Wessner H. Owens Shark, PT, DPT, NCS 07/30/19, 10:56 AM [phone]

## 2019-07-30 NOTE — ED Notes (Addendum)
Attempted to call report to pts new facility [facility], no answer and message said cannot take call right now. Will attempt report again shortly

## 2019-07-30 NOTE — Progress Notes (Addendum)
CSW received a call from [place] at [facility] [place]) of Brittany Blackwell stating the patient has been offered a bed and has been accepted and that the pt can arrive on 8/14.  The pt's accepting doctor is SNF MD.  The room number will be E13.  The number for report is [phone].  CSW will update RN/EDP.  Alphonse Guild. Malcolm Quast, Latanya Presser, LCAS Clinical Social Worker Ph: [phone]

## 2019-07-30 NOTE — Progress Notes (Signed)
CSW updated pt's RN who reiterated what she had told pt's son earlier today: Pt is ambulatory and as such pt will be seen as inappropriate for PTAR and PTAR will likely charge the aprox $800 required for a private pay transport.  CSW called pt's son Ed and stated the above and pt's son stated he would call his daughter to see if his daughter can pick up the pt and stated he would call pt's RN:   [phone] Or the CSW: [phone]  Shon Millet can also be reached at [phone]  RN updated.  CSW will continue to follow for D/C needs.  Alphonse Guild. Atina Feeley, LCSW, LCAS, CSI Transitions of Care Clinical Social Worker Care Coordination Department Ph: [phone]

## 2019-07-30 NOTE — ED Notes (Signed)
  Spoke with husband of pt. Let him know that we are still waiting on social worker to see pt and waiting on placement.

## 2019-07-30 NOTE — ED Notes (Signed)
Offered pt tooth brush/ paste for oral care, pt declined. Pt A+O x4. Waiting on social work consult. Pt offered drink, didn't want anything at this time. Checked bed alarm, and working. Pt watching tv, no other needs at this time.

## 2019-07-30 NOTE — NC FL2 (Signed)
[place] LEVEL OF CARE SCREENING TOOL     IDENTIFICATION  Patient Name: Brittany Blackwell Birthdate: 24-Jul-1918 Sex: female Admission Date (Current Location): 07/29/2019  [place] and [place] Number:  Engineering geologist and Address:  [facility], [address], [place], [place] [address]      Provider Number: [id number]  Attending Physician Name and Address:  No att. providers found  Relative Name and Phone Number:       Current Level of Care: SNF Recommended Level of Care: [place] Prior Approval Number:    Date Approved/Denied:   PASRR Number: [id number]  Discharge Plan: SNF    Current Diagnoses: Patient Active Problem List   Diagnosis Date Noted  . Dizziness 07/19/2019  . Near syncope 07/18/2019  . Closed right hip fracture ([place]) 03/31/2019  . Chicken pox 01/03/2016  . BP (high blood pressure) 01/03/2016  . Degeneration macular 01/03/2016  . Disease of thyroid gland 01/03/2016    Orientation RESPIRATION BLADDER Height & Weight     Self, Time, Situation, Place  Normal Incontinent Weight: 110 lb (49.9 kg) Height:  5\' 3"  (160 cm)  BEHAVIORAL SYMPTOMS/MOOD NEUROLOGICAL BOWEL NUTRITION STATUS      Continent Diet(Heart Healthy)  AMBULATORY STATUS COMMUNICATION OF NEEDS Skin   Limited Assist Verbally Normal                       Personal Care Assistance Level of Assistance  Bathing, Feeding, Dressing Bathing Assistance: Limited assistance Feeding assistance: Independent Dressing Assistance: Limited assistance     Functional Limitations Info  Sight, Speech, Hearing Sight Info: Adequate Hearing Info: Adequate Speech Info: Adequate    SPECIAL CARE FACTORS FREQUENCY  PT (By licensed PT), OT (By licensed OT)     PT Frequency: 5x weekly OT Frequency: 5x weekly            Contractures Contractures Info: Not present    Additional Factors Info  Code Status, Allergies Code Status Info:  Full Allergies Info: Demerol/Meperidine           Current Medications (07/30/2019):  This is the current hospital active medication list Current Facility-Administered Medications  Medication Dose Route Frequency Provider Last Rate Last Dose  . acetaminophen (TYLENOL) tablet 650 mg  650 mg Oral Q6H PRN Hinda Kehr, MD      . amLODipine (NORVASC) tablet 5 mg  5 mg Oral Daily Hinda Kehr, MD   5 mg at 07/30/19 0932  . atenolol (TENORMIN) tablet 50 mg  50 mg Oral Daily Hinda Kehr, MD   50 mg at 07/30/19 0930  . brimonidine (ALPHAGAN) 0.2 % ophthalmic solution 1 drop  1 drop Both Eyes BID Hinda Kehr, MD   1 drop at 07/30/19 0643   And  . timolol (TIMOPTIC) 0.5 % ophthalmic solution 1 drop  1 drop Both Eyes BID Hinda Kehr, MD   1 drop at 07/30/19 [phone]  . budesonide (ENTOCORT EC) 24 hr capsule 9 mg  9 mg Oral Daily Hinda Kehr, MD   9 mg at 07/30/19 1026  . cephALEXin (KEFLEX) capsule 250 mg  250 mg Oral BID Hinda Kehr, MD   250 mg at 07/30/19 0932  . docusate sodium (COLACE) capsule 100 mg  100 mg Oral BID Hinda Kehr, MD   100 mg at 07/30/19 0931  . latanoprost (XALATAN) 0.005 % ophthalmic solution 1 drop  1 drop Both Eyes QHS Hinda Kehr, MD      .  pantoprazole (PROTONIX) EC tablet 40 mg  40 mg Oral Daily Hinda Kehr, MD   40 mg at 07/30/19 0932  . thyroid (ARMOUR) tablet 90 mg  90 mg Oral Daily Hinda Kehr, MD   90 mg at 07/30/19 4656   Current Outpatient Medications  Medication Sig Dispense Refill  . ALPRAZolam (XANAX) 0.25 MG tablet Take 1 tablet (0.25 mg total) by mouth daily as needed. 30 tablet 0  . brimonidine-timolol (COMBIGAN) 0.2-0.5 % ophthalmic solution Combigan 0.2 %-0.5 % eye drops    . budesonide (ENTOCORT EC) 3 MG 24 hr capsule Take 9 mg by mouth daily.     . Cephalexin 250 MG tablet Take 250 mg by mouth 2 (two) times daily. For 7 days    . docusate sodium (COLACE) 100 MG capsule Take 1 capsule (100 mg total) by mouth 2 (two) times daily. 10 capsule 0   . latanoprost (XALATAN) 0.005 % ophthalmic solution Place 1 drop into both eyes at bedtime.     . pantoprazole (PROTONIX) 40 MG tablet Take 1 tablet (40 mg total) by mouth daily. 30 tablet 0  . thyroid (ARMOUR THYROID) 90 MG tablet Take 90 mg by mouth daily.    . timolol (TIMOPTIC) 0.5 % ophthalmic solution Place 1 drop into both eyes daily.     Marland Kitchen acetaminophen (TYLENOL) 325 MG tablet Take 2 tablets (650 mg total) by mouth every 6 (six) hours as needed for mild pain (or Fever >/= 101).    Marland Kitchen amLODipine (NORVASC) 5 MG tablet Take 5 mg by mouth daily.    Marland Kitchen atenolol (TENORMIN) 50 MG tablet Take 50 mg by mouth daily.       Discharge Medications: Please see discharge summary for a list of discharge medications.  Relevant Imaging Results:  Relevant Lab Results:   Additional Information SSN: [id number]  Janace Hoard, LCSW

## 2019-07-30 NOTE — Progress Notes (Signed)
1:10p CSW started insurance auth with [employer]. Awaiting decision.  10:09a CSW contacted patient's son, Ed regarding placement for patient. Ed reports he is wanting patient to go to [facility]. He reports he has the needed paperwork and all patient needed was a negative COVID test (which has been completed).   CSW contact [facility] and they stated they needed and FL2 and insurance auth to accept patient. CSW completed FL2 and had EDP sign.   [place], LCSW Transitions of Care Department [facility] ED [phone]

## 2019-07-30 NOTE — Progress Notes (Signed)
CSW received a call from [employer] rep [place] who states that pt was approved for a 5-day stay.  Per [facility] [place]) of [place] [phone]. Located.  Auth # [id number]  2nd shift ED CSW received a handoff from the 1st shift [facility] ED CSW.   Pt's son wants pt to transport EMS but was informed that pt will most likely receive a bill due to pt's ambulatory status.  Per Crystal at [facility] pt's family hopes for LTC and 5 days were authorized so pt's family can make a plan for LTC if appropriate once pt D/C's from the SNF.  4:01 PM CSW called [facility] and was told they were unaware of pt going there today and would consult Ricky in admissions and give him the auth # for the pt that was provided by the CSW received from [facility].   CSW awaiting return call from [facility]).  CSW will continue to follow for D/C needs.  Alphonse Guild. Jovante Hammitt, LCSW, LCAS, CSI Transitions of Care Clinical Social Worker Care Coordination Department Ph: [phone]

## 2019-07-30 NOTE — Discharge Instructions (Addendum)
Please seek medical attention for any high fevers, chest pain, shortness of breath, change in behavior, persistent vomiting, bloody stool or any other new or concerning symptoms.  

## 2019-07-30 NOTE — ED Notes (Signed)
Pt resting in bed, NAD. Unlabored. Remains to wait for bed at [facility]. No needs at this time.  At sandwich and pretzels from lunch.

## 2019-07-30 NOTE — ED Notes (Signed)
New dressing with xeroform and kerlix placed to skin tear on LLE.

## 2019-07-30 NOTE — ED Notes (Signed)
Tried to encourage pt to hydrate and eat some breakfast. Was able to convince her to take some sips of apple juice. Pt stated she is not hungry right now. Will try again later.

## 2019-07-30 NOTE — ED Provider Notes (Signed)
Patient is resting comfortably, FL 2 form completed.  Vital signs normal.  Social work working with the patient's family, hoping for placement at a facility to occur today.  Patient in no acute distress, boarding pending further disposition.   Delman Kitten, MD 07/30/19 1217

## 2019-07-30 NOTE — Progress Notes (Signed)
CSW spoke to pt's son's wife who verified pt does not have a court-appointed legal guardian.  Per pt's son's wife pt's son Ed considers himself to be the pt's legal guardian but that "he has never been to a court to have that done", per pt's son's wife.  RN updated of the above and that pt's son is en route now to p/u the pt to transport the pt to [facility] [place]) of [place].  CSW will fax the updated AVS to [facility] once RN updated it.  CSW will continue to follow for D/C needs.  Alphonse Guild. Suhaib Guzzo, LCSW, LCAS, CSI Transitions of Care Clinical Social Worker Care Coordination Department Ph: [phone]

## 2019-07-30 NOTE — Progress Notes (Signed)
Per facility pt's accepting RN will need the AVS faxed to: [phone].  RN updated.  CSW will continue to follow for D/C needs.  Alphonse Guild. Stanford Strauch, LCSW, LCAS, CSI Transitions of Care Clinical Social Worker Care Coordination Department Ph: [phone]

## 2019-07-30 NOTE — ED Provider Notes (Signed)
-----------------------------------------   4:59 AM on 07/30/2019 -----------------------------------------   Blood pressure 127/65, pulse 80, temperature 98.4 F (36.9 C), temperature source Oral, resp. rate 16, height 1.6 m (5\' 3" ), weight 49.9 kg, SpO2 93 %.  The patient is calm and cooperative at this time.  Pending PT/OT and social work evaluations.  I verified with pharmacy that her medications have been verified and I ordered her home meds.   Hinda Kehr, MD 07/30/19 [phone]

## 2019-07-30 NOTE — ED Notes (Signed)
Pt awake and eating breakfast.

## 2019-07-30 NOTE — ED Notes (Signed)
Repositioned pt, changed pt gown and placed meal tray in front of her. Pt ate independently.

## 2019-08-03 DIAGNOSIS — M549 Dorsalgia, unspecified: Secondary | ICD-10-CM | POA: Diagnosis not present

## 2019-08-03 DIAGNOSIS — Z9181 History of falling: Secondary | ICD-10-CM | POA: Diagnosis not present

## 2019-08-04 DIAGNOSIS — Z8781 Personal history of (healed) traumatic fracture: Secondary | ICD-10-CM | POA: Diagnosis not present

## 2019-08-04 DIAGNOSIS — S81802D Unspecified open wound, left lower leg, subsequent encounter: Secondary | ICD-10-CM | POA: Diagnosis not present

## 2019-08-04 DIAGNOSIS — K219 Gastro-esophageal reflux disease without esophagitis: Secondary | ICD-10-CM | POA: Diagnosis not present

## 2019-08-04 DIAGNOSIS — I10 Essential (primary) hypertension: Secondary | ICD-10-CM | POA: Diagnosis not present

## 2019-08-04 DIAGNOSIS — Z9181 History of falling: Secondary | ICD-10-CM | POA: Diagnosis not present

## 2019-08-04 DIAGNOSIS — E039 Hypothyroidism, unspecified: Secondary | ICD-10-CM | POA: Diagnosis not present

## 2019-08-04 DIAGNOSIS — H3530 Unspecified macular degeneration: Secondary | ICD-10-CM | POA: Diagnosis not present

## 2019-08-19 DIAGNOSIS — S72001D Fracture of unspecified part of neck of right femur, subsequent encounter for closed fracture with routine healing: Secondary | ICD-10-CM | POA: Diagnosis not present

## 2019-08-19 DIAGNOSIS — Z9181 History of falling: Secondary | ICD-10-CM | POA: Diagnosis not present

## 2019-08-19 DIAGNOSIS — R2681 Unsteadiness on feet: Secondary | ICD-10-CM | POA: Diagnosis not present

## 2019-08-19 DIAGNOSIS — M6281 Muscle weakness (generalized): Secondary | ICD-10-CM | POA: Diagnosis not present

## 2019-08-19 DIAGNOSIS — S72002D Fracture of unspecified part of neck of left femur, subsequent encounter for closed fracture with routine healing: Secondary | ICD-10-CM | POA: Diagnosis not present

## 2019-08-24 DIAGNOSIS — I10 Essential (primary) hypertension: Secondary | ICD-10-CM | POA: Diagnosis not present

## 2019-08-24 DIAGNOSIS — K52831 Collagenous colitis: Secondary | ICD-10-CM | POA: Diagnosis not present

## 2019-09-01 DIAGNOSIS — K52831 Collagenous colitis: Secondary | ICD-10-CM | POA: Diagnosis not present

## 2019-09-01 DIAGNOSIS — I10 Essential (primary) hypertension: Secondary | ICD-10-CM | POA: Diagnosis not present

## 2019-09-02 DIAGNOSIS — U071 COVID-19: Secondary | ICD-10-CM | POA: Diagnosis not present

## 2019-09-16 DIAGNOSIS — S72001D Fracture of unspecified part of neck of right femur, subsequent encounter for closed fracture with routine healing: Secondary | ICD-10-CM | POA: Diagnosis not present

## 2019-09-16 DIAGNOSIS — R2681 Unsteadiness on feet: Secondary | ICD-10-CM | POA: Diagnosis not present

## 2019-09-16 DIAGNOSIS — M6281 Muscle weakness (generalized): Secondary | ICD-10-CM | POA: Diagnosis not present

## 2019-09-16 DIAGNOSIS — Z9181 History of falling: Secondary | ICD-10-CM | POA: Diagnosis not present

## 2019-09-16 DIAGNOSIS — S72002D Fracture of unspecified part of neck of left femur, subsequent encounter for closed fracture with routine healing: Secondary | ICD-10-CM | POA: Diagnosis not present

## 2019-09-17 DIAGNOSIS — E0590 Thyrotoxicosis, unspecified without thyrotoxic crisis or storm: Secondary | ICD-10-CM | POA: Diagnosis not present

## 2019-09-17 DIAGNOSIS — E039 Hypothyroidism, unspecified: Secondary | ICD-10-CM | POA: Diagnosis not present

## 2019-09-21 DIAGNOSIS — E039 Hypothyroidism, unspecified: Secondary | ICD-10-CM | POA: Diagnosis not present

## 2019-10-04 ENCOUNTER — Ambulatory Visit: Payer: PPO | Admitting: Podiatry

## 2019-10-18 DIAGNOSIS — M6281 Muscle weakness (generalized): Secondary | ICD-10-CM | POA: Diagnosis not present

## 2019-10-18 DIAGNOSIS — Z9181 History of falling: Secondary | ICD-10-CM | POA: Diagnosis not present

## 2019-10-18 DIAGNOSIS — S72001D Fracture of unspecified part of neck of right femur, subsequent encounter for closed fracture with routine healing: Secondary | ICD-10-CM | POA: Diagnosis not present

## 2019-10-18 DIAGNOSIS — S72002D Fracture of unspecified part of neck of left femur, subsequent encounter for closed fracture with routine healing: Secondary | ICD-10-CM | POA: Diagnosis not present

## 2019-10-18 DIAGNOSIS — R2681 Unsteadiness on feet: Secondary | ICD-10-CM | POA: Diagnosis not present

## 2019-10-20 DIAGNOSIS — E039 Hypothyroidism, unspecified: Secondary | ICD-10-CM | POA: Diagnosis not present

## 2019-10-20 DIAGNOSIS — I10 Essential (primary) hypertension: Secondary | ICD-10-CM | POA: Diagnosis not present

## 2019-10-20 DIAGNOSIS — H409 Unspecified glaucoma: Secondary | ICD-10-CM | POA: Diagnosis not present

## 2019-10-20 DIAGNOSIS — F419 Anxiety disorder, unspecified: Secondary | ICD-10-CM | POA: Diagnosis not present

## 2019-10-20 DIAGNOSIS — K529 Noninfective gastroenteritis and colitis, unspecified: Secondary | ICD-10-CM | POA: Diagnosis not present

## 2019-10-20 DIAGNOSIS — K219 Gastro-esophageal reflux disease without esophagitis: Secondary | ICD-10-CM | POA: Diagnosis not present

## 2019-10-20 DIAGNOSIS — U071 COVID-19: Secondary | ICD-10-CM | POA: Diagnosis not present

## 2019-10-21 DIAGNOSIS — K52831 Collagenous colitis: Secondary | ICD-10-CM | POA: Diagnosis not present

## 2019-10-21 DIAGNOSIS — Z4789 Encounter for other orthopedic aftercare: Secondary | ICD-10-CM | POA: Diagnosis not present

## 2019-10-29 ENCOUNTER — Inpatient Hospital Stay: Payer: PPO

## 2019-10-29 ENCOUNTER — Encounter
Admission: EM | Disposition: A | Discharge status: Skilled Nursing Facility | Payer: Self-pay | Source: Skilled Nursing Facility | Attending: Internal Medicine

## 2019-10-29 ENCOUNTER — Other Ambulatory Visit: Payer: Self-pay

## 2019-10-29 ENCOUNTER — Emergency Department: Payer: PPO

## 2019-10-29 ENCOUNTER — Inpatient Hospital Stay: Payer: PPO | Admitting: Certified Registered"

## 2019-10-29 ENCOUNTER — Inpatient Hospital Stay
Admission: EM | Admit: 2019-10-29 | Discharge: 2019-11-01 | DRG: 521 | Disposition: A | Discharge status: Skilled Nursing Facility | Payer: PPO | Source: Skilled Nursing Facility | Attending: Internal Medicine | Admitting: Internal Medicine

## 2019-10-29 ENCOUNTER — Encounter: Payer: Self-pay | Admitting: Emergency Medicine

## 2019-10-29 DIAGNOSIS — Z87891 Personal history of nicotine dependence: Secondary | ICD-10-CM

## 2019-10-29 DIAGNOSIS — Y92129 Unspecified place in nursing home as the place of occurrence of the external cause: Secondary | ICD-10-CM

## 2019-10-29 DIAGNOSIS — I10 Essential (primary) hypertension: Secondary | ICD-10-CM | POA: Diagnosis present

## 2019-10-29 DIAGNOSIS — S72002A Fracture of unspecified part of neck of left femur, initial encounter for closed fracture: Principal | ICD-10-CM

## 2019-10-29 DIAGNOSIS — H409 Unspecified glaucoma: Secondary | ICD-10-CM | POA: Diagnosis present

## 2019-10-29 DIAGNOSIS — D649 Anemia, unspecified: Secondary | ICD-10-CM | POA: Diagnosis present

## 2019-10-29 DIAGNOSIS — Z20828 Contact with and (suspected) exposure to other viral communicable diseases: Secondary | ICD-10-CM | POA: Diagnosis not present

## 2019-10-29 DIAGNOSIS — M25552 Pain in left hip: Secondary | ICD-10-CM | POA: Diagnosis not present

## 2019-10-29 DIAGNOSIS — R41841 Cognitive communication deficit: Secondary | ICD-10-CM | POA: Diagnosis not present

## 2019-10-29 DIAGNOSIS — Z9181 History of falling: Secondary | ICD-10-CM | POA: Diagnosis not present

## 2019-10-29 DIAGNOSIS — M6281 Muscle weakness (generalized): Secondary | ICD-10-CM | POA: Diagnosis not present

## 2019-10-29 DIAGNOSIS — S72092A Other fracture of head and neck of left femur, initial encounter for closed fracture: Secondary | ICD-10-CM | POA: Diagnosis not present

## 2019-10-29 DIAGNOSIS — R2681 Unsteadiness on feet: Secondary | ICD-10-CM | POA: Diagnosis not present

## 2019-10-29 DIAGNOSIS — K219 Gastro-esophageal reflux disease without esophagitis: Secondary | ICD-10-CM | POA: Diagnosis not present

## 2019-10-29 DIAGNOSIS — J9621 Acute and chronic respiratory failure with hypoxia: Secondary | ICD-10-CM

## 2019-10-29 DIAGNOSIS — J9601 Acute respiratory failure with hypoxia: Secondary | ICD-10-CM | POA: Diagnosis not present

## 2019-10-29 DIAGNOSIS — M542 Cervicalgia: Secondary | ICD-10-CM | POA: Diagnosis present

## 2019-10-29 DIAGNOSIS — H3530 Unspecified macular degeneration: Secondary | ICD-10-CM | POA: Diagnosis present

## 2019-10-29 DIAGNOSIS — Z03818 Encounter for observation for suspected exposure to other biological agents ruled out: Secondary | ICD-10-CM | POA: Diagnosis not present

## 2019-10-29 DIAGNOSIS — E039 Hypothyroidism, unspecified: Secondary | ICD-10-CM

## 2019-10-29 DIAGNOSIS — R279 Unspecified lack of coordination: Secondary | ICD-10-CM | POA: Diagnosis not present

## 2019-10-29 DIAGNOSIS — R55 Syncope and collapse: Secondary | ICD-10-CM | POA: Diagnosis not present

## 2019-10-29 DIAGNOSIS — Z803 Family history of malignant neoplasm of breast: Secondary | ICD-10-CM

## 2019-10-29 DIAGNOSIS — R1311 Dysphagia, oral phase: Secondary | ICD-10-CM | POA: Diagnosis not present

## 2019-10-29 DIAGNOSIS — S72002D Fracture of unspecified part of neck of left femur, subsequent encounter for closed fracture with routine healing: Secondary | ICD-10-CM | POA: Diagnosis not present

## 2019-10-29 DIAGNOSIS — K5900 Constipation, unspecified: Secondary | ICD-10-CM | POA: Diagnosis present

## 2019-10-29 DIAGNOSIS — Z741 Need for assistance with personal care: Secondary | ICD-10-CM | POA: Diagnosis not present

## 2019-10-29 DIAGNOSIS — S199XXA Unspecified injury of neck, initial encounter: Secondary | ICD-10-CM | POA: Diagnosis not present

## 2019-10-29 DIAGNOSIS — Z8249 Family history of ischemic heart disease and other diseases of the circulatory system: Secondary | ICD-10-CM | POA: Diagnosis not present

## 2019-10-29 DIAGNOSIS — S72002S Fracture of unspecified part of neck of left femur, sequela: Secondary | ICD-10-CM | POA: Diagnosis not present

## 2019-10-29 DIAGNOSIS — Z743 Need for continuous supervision: Secondary | ICD-10-CM | POA: Diagnosis not present

## 2019-10-29 DIAGNOSIS — R0902 Hypoxemia: Secondary | ICD-10-CM | POA: Diagnosis not present

## 2019-10-29 DIAGNOSIS — J95821 Acute postprocedural respiratory failure: Secondary | ICD-10-CM | POA: Diagnosis not present

## 2019-10-29 DIAGNOSIS — W19XXXA Unspecified fall, initial encounter: Secondary | ICD-10-CM | POA: Diagnosis not present

## 2019-10-29 DIAGNOSIS — Z9889 Other specified postprocedural states: Secondary | ICD-10-CM

## 2019-10-29 DIAGNOSIS — Z66 Do not resuscitate: Secondary | ICD-10-CM | POA: Diagnosis not present

## 2019-10-29 DIAGNOSIS — W010XXA Fall on same level from slipping, tripping and stumbling without subsequent striking against object, initial encounter: Secondary | ICD-10-CM | POA: Diagnosis present

## 2019-10-29 DIAGNOSIS — Z96642 Presence of left artificial hip joint: Secondary | ICD-10-CM | POA: Diagnosis not present

## 2019-10-29 DIAGNOSIS — R531 Weakness: Secondary | ICD-10-CM | POA: Diagnosis not present

## 2019-10-29 DIAGNOSIS — Z419 Encounter for procedure for purposes other than remedying health state, unspecified: Secondary | ICD-10-CM

## 2019-10-29 DIAGNOSIS — M25551 Pain in right hip: Secondary | ICD-10-CM | POA: Diagnosis not present

## 2019-10-29 DIAGNOSIS — Z471 Aftercare following joint replacement surgery: Secondary | ICD-10-CM | POA: Diagnosis not present

## 2019-10-29 DIAGNOSIS — S72009A Fracture of unspecified part of neck of unspecified femur, initial encounter for closed fracture: Secondary | ICD-10-CM | POA: Diagnosis present

## 2019-10-29 HISTORY — DX: Unspecified macular degeneration: H35.30

## 2019-10-29 HISTORY — PX: ANTERIOR APPROACH HEMI HIP ARTHROPLASTY: SHX6690

## 2019-10-29 HISTORY — DX: Unspecified glaucoma: H40.9

## 2019-10-29 LAB — CBC WITH DIFFERENTIAL/PLATELET
Abs Immature Granulocytes: 0.08 10*3/uL — ABNORMAL HIGH (ref 0.00–0.07)
Basophils Absolute: 0 10*3/uL (ref 0.0–0.1)
Basophils Relative: 0 %
Eosinophils Absolute: 0.1 10*3/uL (ref 0.0–0.5)
Eosinophils Relative: 1 %
HCT: 33.2 % — ABNORMAL LOW (ref 36.0–46.0)
Hemoglobin: 10.9 g/dL — ABNORMAL LOW (ref 12.0–15.0)
Immature Granulocytes: 1 %
Lymphocytes Relative: 8 %
Lymphs Abs: 1 10*3/uL (ref 0.7–4.0)
MCH: 28.2 pg (ref 26.0–34.0)
MCHC: 32.8 g/dL (ref 30.0–36.0)
MCV: 86 fL (ref 80.0–100.0)
Monocytes Absolute: 0.8 10*3/uL (ref 0.1–1.0)
Monocytes Relative: 7 %
Neutro Abs: 10 10*3/uL — ABNORMAL HIGH (ref 1.7–7.7)
Neutrophils Relative %: 83 %
Platelets: 174 10*3/uL (ref 150–400)
RBC: 3.86 MIL/uL — ABNORMAL LOW (ref 3.87–5.11)
RDW: 15.4 % (ref 11.5–15.5)
WBC: 12.1 10*3/uL — ABNORMAL HIGH (ref 4.0–10.5)
nRBC: 0 % (ref 0.0–0.2)

## 2019-10-29 LAB — BASIC METABOLIC PANEL
Anion gap: 9 (ref 5–15)
BUN: 27 mg/dL — ABNORMAL HIGH (ref 8–23)
CO2: 23 mmol/L (ref 22–32)
Calcium: 8.9 mg/dL (ref 8.9–10.3)
Chloride: 103 mmol/L (ref 98–111)
Creatinine, Ser: 0.92 mg/dL (ref 0.44–1.00)
GFR calc Af Amer: 60 mL/min — ABNORMAL LOW (ref >60)
GFR calc non Af Amer: 51 mL/min — ABNORMAL LOW (ref >60)
Glucose, Bld: 123 mg/dL — ABNORMAL HIGH (ref 70–99)
Potassium: 3.8 mmol/L (ref 3.5–5.1)
Sodium: 135 mmol/L (ref 135–145)

## 2019-10-29 LAB — SARS CORONAVIRUS 2 BY RT PCR (HOSPITAL ORDER, PERFORMED IN ~~LOC~~ HOSPITAL LAB): SARS Coronavirus 2: NEGATIVE

## 2019-10-29 SURGERY — HEMIARTHROPLASTY, HIP, DIRECT ANTERIOR APPROACH, FOR FRACTURE
Anesthesia: Spinal | Site: Hip | Laterality: Left

## 2019-10-29 MED ORDER — PANTOPRAZOLE SODIUM 40 MG PO TBEC
40.0000 mg | DELAYED_RELEASE_TABLET | Freq: Every day | ORAL | Status: DC
  Administered 2019-10-30 – 2019-11-01 (×3): 40 mg via ORAL
  Filled 2019-10-29 (×3): qty 1

## 2019-10-29 MED ORDER — TIMOLOL MALEATE 0.5 % OP SOLN
1.0000 [drp] | Freq: Every day | OPHTHALMIC | Status: DC
  Administered 2019-10-30 – 2019-11-01 (×3): 1 [drp] via OPHTHALMIC
  Filled 2019-10-29: qty 5

## 2019-10-29 MED ORDER — PHENYLEPHRINE HCL (PRESSORS) 10 MG/ML IV SOLN
INTRAVENOUS | Status: DC | PRN
  Administered 2019-10-29: 80 ug via INTRAVENOUS
  Administered 2019-10-29: 60 ug via INTRAVENOUS
  Administered 2019-10-29: 100 ug via INTRAVENOUS
  Administered 2019-10-29: 80 ug via INTRAVENOUS
  Administered 2019-10-29 (×2): 100 ug via INTRAVENOUS
  Administered 2019-10-29: 80 ug via INTRAVENOUS

## 2019-10-29 MED ORDER — FLEET ENEMA 7-19 GM/118ML RE ENEM: 1.0000 | ENEMA | Freq: Once | RECTAL | Status: DC | PRN

## 2019-10-29 MED ORDER — ONDANSETRON HCL 4 MG/2ML IJ SOLN: 4.0000 mg | Freq: Four times a day (QID) | INTRAMUSCULAR | Status: DC | PRN

## 2019-10-29 MED ORDER — FENTANYL CITRATE (PF) 250 MCG/5ML IJ SOLN
INTRAMUSCULAR | Status: DC | PRN
  Administered 2019-10-29: 25 ug via INTRAVENOUS

## 2019-10-29 MED ORDER — FENTANYL CITRATE (PF) 100 MCG/2ML IJ SOLN: 25.0000 ug | INTRAMUSCULAR | Status: DC | PRN

## 2019-10-29 MED ORDER — TRAMADOL HCL 50 MG PO TABS
50.0000 mg | ORAL_TABLET | Freq: Four times a day (QID) | ORAL | Status: DC
  Administered 2019-10-31 – 2019-11-01 (×4): 50 mg via ORAL
  Filled 2019-10-29 (×5): qty 1

## 2019-10-29 MED ORDER — BUPIVACAINE HCL (PF) 0.5 % IJ SOLN
INTRAMUSCULAR | Status: DC | PRN
  Administered 2019-10-29: 3 mL

## 2019-10-29 MED ORDER — SODIUM CHLORIDE 0.9 % IR SOLN
Status: DC | PRN
  Administered 2019-10-29: 4000 mL

## 2019-10-29 MED ORDER — SODIUM CHLORIDE 0.9 % IV SOLN
INTRAVENOUS | Status: DC
  Administered 2019-10-29: 23:00:00 via INTRAVENOUS

## 2019-10-29 MED ORDER — MORPHINE SULFATE (PF) 2 MG/ML IV SOLN: 0.5000 mg | INTRAVENOUS | Status: DC | PRN

## 2019-10-29 MED ORDER — FENTANYL CITRATE (PF) 100 MCG/2ML IJ SOLN
INTRAMUSCULAR | Status: AC
  Filled 2019-10-29: qty 2

## 2019-10-29 MED ORDER — BACITRACIN 50000 UNITS IM SOLR
INTRAMUSCULAR | Status: AC
  Filled 2019-10-29: qty 1

## 2019-10-29 MED ORDER — ACETAMINOPHEN 325 MG PO TABS
650.0000 mg | ORAL_TABLET | Freq: Four times a day (QID) | ORAL | Status: DC | PRN
  Administered 2019-10-31: 10:00:00 650 mg via ORAL
  Filled 2019-10-29: qty 2

## 2019-10-29 MED ORDER — BUPIVACAINE HCL 0.5 % IJ SOLN
INTRAMUSCULAR | Status: DC | PRN
  Administered 2019-10-29: 15 mL

## 2019-10-29 MED ORDER — ONDANSETRON HCL 4 MG PO TABS: 4.0000 mg | ORAL_TABLET | Freq: Four times a day (QID) | ORAL | Status: DC | PRN

## 2019-10-29 MED ORDER — LIDOCAINE 1 % OPTIME INJ - NO CHARGE
INTRAMUSCULAR | Status: DC | PRN
  Administered 2019-10-29: 15 mL

## 2019-10-29 MED ORDER — CEFAZOLIN SODIUM-DEXTROSE 2-3 GM-%(50ML) IV SOLR
INTRAVENOUS | Status: DC | PRN
  Administered 2019-10-29: 2 g via INTRAVENOUS

## 2019-10-29 MED ORDER — PHENOL 1.4 % MT LIQD
1.0000 | OROMUCOSAL | Status: DC | PRN
  Filled 2019-10-29: qty 177

## 2019-10-29 MED ORDER — LATANOPROST 0.005 % OP SOLN
1.0000 [drp] | Freq: Every day | OPHTHALMIC | Status: DC
  Administered 2019-10-30 – 2019-10-31 (×2): 1 [drp] via OPHTHALMIC
  Filled 2019-10-29: qty 2.5

## 2019-10-29 MED ORDER — PROPOFOL 500 MG/50ML IV EMUL
INTRAVENOUS | Status: DC | PRN
  Administered 2019-10-29: 25 ug/kg/min via INTRAVENOUS

## 2019-10-29 MED ORDER — METOCLOPRAMIDE HCL 10 MG PO TABS: 5.0000 mg | ORAL_TABLET | Freq: Three times a day (TID) | ORAL | Status: DC | PRN

## 2019-10-29 MED ORDER — ONDANSETRON HCL 4 MG/2ML IJ SOLN: 4.0000 mg | Freq: Once | INTRAMUSCULAR | Status: DC | PRN

## 2019-10-29 MED ORDER — MENTHOL 3 MG MT LOZG
1.0000 | LOZENGE | OROMUCOSAL | Status: DC | PRN
  Filled 2019-10-29: qty 9

## 2019-10-29 MED ORDER — MIDAZOLAM HCL 2 MG/2ML IJ SOLN
INTRAMUSCULAR | Status: AC
  Filled 2019-10-29: qty 2

## 2019-10-29 MED ORDER — HYDROCODONE-ACETAMINOPHEN 5-325 MG PO TABS: 1.0000 | ORAL_TABLET | Freq: Four times a day (QID) | ORAL | Status: DC | PRN

## 2019-10-29 MED ORDER — GABAPENTIN 300 MG PO CAPS
300.0000 mg | ORAL_CAPSULE | Freq: Every day | ORAL | Status: DC
  Administered 2019-10-30 – 2019-10-31 (×2): 300 mg via ORAL
  Filled 2019-10-29 (×2): qty 1

## 2019-10-29 MED ORDER — HYDROCODONE-ACETAMINOPHEN 5-325 MG PO TABS: 1.0000 | ORAL_TABLET | ORAL | Status: DC | PRN

## 2019-10-29 MED ORDER — ENOXAPARIN SODIUM 40 MG/0.4ML ~~LOC~~ SOLN: 40.0000 mg | SUBCUTANEOUS | Status: DC

## 2019-10-29 MED ORDER — ATENOLOL 50 MG PO TABS
50.0000 mg | ORAL_TABLET | Freq: Every day | ORAL | Status: DC
  Administered 2019-10-30 – 2019-10-31 (×2): 50 mg via ORAL
  Filled 2019-10-29 (×2): qty 1
  Filled 2019-10-29 (×2): qty 2
  Filled 2019-10-29: qty 1

## 2019-10-29 MED ORDER — BUDESONIDE 3 MG PO CPEP
9.0000 mg | ORAL_CAPSULE | Freq: Every day | ORAL | Status: DC
  Administered 2019-10-30 – 2019-11-01 (×3): 9 mg via ORAL
  Filled 2019-10-29 (×4): qty 3

## 2019-10-29 MED ORDER — METOCLOPRAMIDE HCL 5 MG/ML IJ SOLN: 5.0000 mg | Freq: Three times a day (TID) | INTRAMUSCULAR | Status: DC | PRN

## 2019-10-29 MED ORDER — FERROUS SULFATE 325 (65 FE) MG PO TABS
325.0000 mg | ORAL_TABLET | Freq: Two times a day (BID) | ORAL | Status: DC
  Administered 2019-10-30 – 2019-11-01 (×4): 325 mg via ORAL
  Filled 2019-10-29 (×4): qty 1

## 2019-10-29 MED ORDER — CEFAZOLIN SODIUM-DEXTROSE 2-4 GM/100ML-% IV SOLN
2.0000 g | Freq: Four times a day (QID) | INTRAVENOUS | Status: AC
  Administered 2019-10-29 – 2019-10-30 (×4): 2 g via INTRAVENOUS
  Filled 2019-10-29 (×4): qty 100

## 2019-10-29 MED ORDER — MAGNESIUM HYDROXIDE 400 MG/5ML PO SUSP
30.0000 mL | Freq: Every day | ORAL | Status: DC | PRN
  Administered 2019-10-31: 08:00:00 30 mL via ORAL
  Filled 2019-10-29: qty 30

## 2019-10-29 MED ORDER — BISACODYL 10 MG RE SUPP
10.0000 mg | Freq: Every day | RECTAL | Status: DC | PRN
  Administered 2019-10-31: 10 mg via RECTAL
  Filled 2019-10-29: qty 1

## 2019-10-29 MED ORDER — BUPIVACAINE HCL (PF) 0.5 % IJ SOLN
INTRAMUSCULAR | Status: AC
  Filled 2019-10-29: qty 30

## 2019-10-29 MED ORDER — LIDOCAINE HCL (PF) 1 % IJ SOLN
INTRAMUSCULAR | Status: AC
  Filled 2019-10-29: qty 30

## 2019-10-29 MED ORDER — SODIUM CHLORIDE 0.9 % IV SOLN
INTRAVENOUS | Status: DC | PRN
  Administered 2019-10-29: 16:00:00 via INTRAVENOUS

## 2019-10-29 MED ORDER — OCUVITE-LUTEIN PO CAPS
1.0000 | ORAL_CAPSULE | Freq: Two times a day (BID) | ORAL | Status: DC
  Administered 2019-10-30 – 2019-11-01 (×5): 1 via ORAL
  Filled 2019-10-29 (×7): qty 1

## 2019-10-29 MED ORDER — ACETAMINOPHEN 500 MG PO TABS
500.0000 mg | ORAL_TABLET | Freq: Four times a day (QID) | ORAL | Status: DC
  Administered 2019-10-30: 500 mg via ORAL
  Filled 2019-10-29: qty 1

## 2019-10-29 MED ORDER — THYROID 60 MG PO TABS
90.0000 mg | ORAL_TABLET | Freq: Every day | ORAL | Status: DC
  Administered 2019-10-30 – 2019-11-01 (×3): 90 mg via ORAL
  Filled 2019-10-29 (×4): qty 1

## 2019-10-29 MED ORDER — MIDAZOLAM HCL 5 MG/5ML IJ SOLN
INTRAMUSCULAR | Status: DC | PRN
  Administered 2019-10-29: 1 mg via INTRAVENOUS

## 2019-10-29 MED ORDER — BUPIVACAINE HCL (PF) 0.5 % IJ SOLN
INTRAMUSCULAR | Status: AC
  Filled 2019-10-29: qty 10

## 2019-10-29 MED ORDER — SODIUM CHLORIDE 0.45 % IV SOLN
INTRAVENOUS | Status: DC
  Administered 2019-10-29: 13:00:00 via INTRAVENOUS

## 2019-10-29 SURGICAL SUPPLY — 47 items
BLADE SAGITTAL WIDE XTHICK NO (BLADE) ×3 IMPLANT
BRUSH SCRUB EZ  4% CHG (MISCELLANEOUS) ×4
BRUSH SCRUB EZ 4% CHG (MISCELLANEOUS) ×2 IMPLANT
CHLORAPREP W/TINT 26 (MISCELLANEOUS) ×3 IMPLANT
COVER WAND RF STERILE (DRAPES) ×3 IMPLANT
DRAPE 3/4 80X56 (DRAPES) ×3 IMPLANT
DRAPE C-ARM 42X72 X-RAY (DRAPES) ×3 IMPLANT
DRAPE STERI IOBAN 125X83 (DRAPES) IMPLANT
DRSG AQUACEL AG ADV 3.5X10 (GAUZE/BANDAGES/DRESSINGS) IMPLANT
DRSG AQUACEL AG ADV 3.5X14 (GAUZE/BANDAGES/DRESSINGS) IMPLANT
ELECT BLADE 6.5 EXT (BLADE) ×3 IMPLANT
ELECT REM PT RETURN 9FT ADLT (ELECTROSURGICAL) ×3
ELECTRODE REM PT RTRN 9FT ADLT (ELECTROSURGICAL) ×1 IMPLANT
FEMORAL HEAD LFIT V40 26M 3M (Head) ×1 IMPLANT
GAUZE XEROFORM 1X8 LF (GAUZE/BANDAGES/DRESSINGS) IMPLANT
GLOVE INDICATOR 8.0 STRL GRN (GLOVE) ×3 IMPLANT
GLOVE SURG ORTHO 8.0 STRL STRW (GLOVE) ×6 IMPLANT
GOWN STRL REUS W/ TWL LRG LVL3 (GOWN DISPOSABLE) ×1 IMPLANT
GOWN STRL REUS W/ TWL XL LVL3 (GOWN DISPOSABLE) ×1 IMPLANT
GOWN STRL REUS W/TWL LRG LVL3 (GOWN DISPOSABLE) ×2
GOWN STRL REUS W/TWL XL LVL3 (GOWN DISPOSABLE) ×2
HEAD BP UNV 47X26XHIP FEM (Orthopedic Implant) ×1 IMPLANT
HEAD OSTEO BIPOLAR (Orthopedic Implant) ×2 IMPLANT
HIP HD LFIT 26/5 (Head) ×3 IMPLANT
HOOD PEEL AWAY FLYTE STAYCOOL (MISCELLANEOUS) ×9 IMPLANT
IV NS 1000ML (IV SOLUTION) ×2
IV NS 1000ML BAXH (IV SOLUTION) ×1 IMPLANT
KIT PATIENT CARE HANA TABLE (KITS) ×3 IMPLANT
KIT TURNOVER CYSTO (KITS) ×3 IMPLANT
MAT ABSORB  FLUID 56X50 GRAY (MISCELLANEOUS) ×2
MAT ABSORB FLUID 56X50 GRAY (MISCELLANEOUS) ×1 IMPLANT
NDL SAFETY ECLIPSE 18X1.5 (NEEDLE) ×2 IMPLANT
NEEDLE HYPO 18GX1.5 SHARP (NEEDLE) ×4
NEEDLE HYPO 22GX1.5 SAFETY (NEEDLE) ×3 IMPLANT
NEEDLE SPNL 20GX3.5 QUINCKE YW (NEEDLE) ×3 IMPLANT
PACK HIP PROSTHESIS (MISCELLANEOUS) ×3 IMPLANT
PADDING CAST BLEND 4X4 NS (MISCELLANEOUS) ×6 IMPLANT
PILLOW ABDUCTION MEDIUM (MISCELLANEOUS) ×3 IMPLANT
PULSAVAC PLUS IRRIG FAN TIP (DISPOSABLE) ×3
STAPLER SKIN PROX 35W (STAPLE) ×3 IMPLANT
STEM HIP 127 DEG (Stem) ×3 IMPLANT
SUT BONE WAX W31G (SUTURE) ×3 IMPLANT
SUT DVC 2 QUILL PDO  T11 36X36 (SUTURE) ×2
SUT DVC 2 QUILL PDO T11 36X36 (SUTURE) ×1 IMPLANT
SUT VIC AB 2-0 CT1 18 (SUTURE) ×3 IMPLANT
SYR 20ML LL LF (SYRINGE) ×3 IMPLANT
TIP FAN IRRIG PULSAVAC PLUS (DISPOSABLE) ×1 IMPLANT

## 2019-10-29 NOTE — Progress Notes (Signed)
Spoke with son Ed. Verified with him who patient is and what was being done. Ed did confirm this. Gave [place] permission to perform surgery. Anesthesia spoke with Ed as well.

## 2019-10-29 NOTE — ED Notes (Signed)
Unable to insert foley catheter at this time due to pt agitation. Pt refuses further attempt at inserting foley. Pt refuses po medications at this time. States that she will nott take any medications from [place]. Will continue to monitor and attempt later.

## 2019-10-29 NOTE — Progress Notes (Signed)
Patient is refusing to answer questions or let us take vitals. Says, "I can see right through you, why should I answer your questions the way you have treated me." Redirected patient multiple times without success. Patient then stated '' you turned scott against me''

## 2019-10-29 NOTE — Anesthesia Post-op Follow-up Note (Signed)
Anesthesia QCDR form completed.        

## 2019-10-29 NOTE — ED Notes (Signed)
Pt left for OR with or tech.  All belongings with pt. Consent sent with pt.

## 2019-10-29 NOTE — Transfer of Care (Signed)
Immediate Anesthesia Transfer of Care Note  Patient: Brittany Blackwell  Procedure(s) Performed: ANTERIOR APPROACH HEMI HIP ARTHROPLASTY (Left Hip)  Patient Location: PACU  Anesthesia Type:Spinal  Level of Consciousness: awake  Airway & Oxygen Therapy: Patient Spontanous Breathing and Patient connected to nasal cannula oxygen  Post-op Assessment: Report given to RN and Post -op Vital signs reviewed and stable  Post vital signs: Reviewed and stable  Last Vitals:  Vitals Value Taken Time  BP    Temp    Pulse    Resp    SpO2      Last Pain:  Vitals:   10/29/19 1230  TempSrc:   PainSc: 4          Complications: No apparent anesthesia complications

## 2019-10-29 NOTE — ED Notes (Signed)
Admitting provider at bedside.

## 2019-10-29 NOTE — ED Notes (Signed)
Updated [place] OR charge RN

## 2019-10-29 NOTE — ED Notes (Signed)
Patient transported to CT 

## 2019-10-29 NOTE — H&P (Signed)
History and Physical    Brittany Blackwell [id number] DOB: November 06, 1918 DOA: 10/29/2019  PCP: Maryland Pink, MD (Confirm with patient/family/NH records and if not entered, this has to be entered at [facility] point of entry) Patient coming from: Nursing facility  I have personally briefly reviewed patient's old medical records in [place]  Chief Complaint: Left hip fracture  HPI: Brittany Blackwell is a 85 y.o. female with medical history significant of hypertension in the emergency room for left hip fracture.  Patient reports that last night coming home from dinner was on her way to the bathroom prior to bed and fell she missed a step she does not recall any dizziness.  Patient denies any other symptoms prior to this fall.  Patient reports baseline function over the past few weeks.  Patient denies any pain.  ED Course:  Blood pressure (!) 166/71, pulse 89, temperature 98.5 F (36.9 C), temperature source Oral, resp. rate 17, height 5\' 5"  (1.651 m), weight 54.4 kg, SpO2 93 %. Patient is alert awake oriented to person place and time. Upon initial evaluation left hip x-ray obtained shows fracture. Initial metabolic panel shows mild elevation in BUN of 27 suspect is from dehydration, creatinine of 0.92 within tomato GFR of 51. Initial CBC shows mild elevation of white count to 12.1, hemoglobin of 10.9 which appears to be similar to her previous blood work, RDW of 15.4, platelet count of 174.  COVID-19 PCR is pending.  Nischal chest x-ray is negative, left hip x-ray showsModerately displaced proximal left femoral neck fracture.  Review of Systems: As per HPI otherwise 10 point review of systems negative.   Past Medical History:  Diagnosis Date  . Acid reflux   . Glaucoma   . Hypertension   . Macular degeneration   . Thyroid disease     Past Surgical History:  Procedure Laterality Date  . ABDOMINAL HYSTERECTOMY    . HIP PINNING,CANNULATED Right 04/01/2019   Procedure: CANNULATED HIP PINNING;   Surgeon: Lovell Sheehan, MD;  Location: [facility] ORS;  Service: Orthopedics;  Laterality: Right;  . TONSILLECTOMY       reports that she has quit smoking. She has never used smokeless tobacco. She reports that she does not drink alcohol or use drugs.  Allergies  Allergen Reactions  . Demerol [Meperidine] Other (See Comments)    BP drops    Family History  Problem Relation Age of Onset  . Breast cancer Mother   . Heart attack Father    Acceptable: Family history reviewed and not pertinent (If you reviewed it)  Prior to Admission medications   Medication Sig Start Date End Date Taking? Authorizing Provider  acetaminophen (TYLENOL) 325 MG tablet Take 2 tablets (650 mg total) by mouth every 6 (six) hours as needed for mild pain (or Fever >/= 101). 04/03/19  Yes Dustin Flock, MD  amLODipine (NORVASC) 10 MG tablet Take 10 mg by mouth daily.    Yes [provider]  atenolol (TENORMIN) 50 MG tablet Take 50 mg by mouth daily.   Yes [provider]  budesonide (ENTOCORT EC) 3 MG 24 hr capsule Take 9 mg by mouth daily.  02/12/19  Yes [provider]  latanoprost (XALATAN) 0.005 % ophthalmic solution Place 1 drop into both eyes at bedtime.  03/22/19  Yes [provider]  Multiple Vitamins-Minerals (PRESERVISION AREDS 2+MULTI VIT PO) Take 1 tablet by mouth 2 (two) times daily.    Yes [provider]  pantoprazole (PROTONIX) 40  MG tablet Take 1 tablet (40 mg total) by mouth daily. 07/21/19  Yes Vaughan Basta, MD  thyroid [facility] THYROID) 90 MG tablet Take 90 mg by mouth daily.   Yes [provider]  timolol (TIMOPTIC) 0.5 % ophthalmic solution Place 1 drop into both eyes daily.  03/22/19  Yes [provider]    Physical Exam: Vitals:   10/29/19 0915 10/29/19 0917 10/29/19 1143 10/29/19 1230  BP: (!) 160/75  (!) 166/71 (!) 157/70  Pulse: 76  89 98  Resp: 16  17 20   Temp: 98.7 F (37.1 C)  98.5 F (36.9 C)   TempSrc: Oral  Oral    SpO2: 90%  93% 96%  Weight:  54.4 kg    Height:  5\' 5"  (1.651 m)      Constitutional: NAD, calm, comfortable Vitals:   10/29/19 0915 10/29/19 0917 10/29/19 1143 10/29/19 1230  BP: (!) 160/75  (!) 166/71 (!) 157/70  Pulse: 76  89 98  Resp: 16  17 20   Temp: 98.7 F (37.1 C)  98.5 F (36.9 C)   TempSrc: Oral  Oral   SpO2: 90%  93% 96%  Weight:  54.4 kg    Height:  5\' 5"  (1.651 m)     Eyes: PERRL, lids and conjunctivae normal ENMT: Mucous membranes are moist. Posterior pharynx clear of any exudate or lesions.Normal dentition.  Neck: normal, supple, no masses, no thyromegaly Respiratory: clear to auscultation bilaterally, no wheezing, no crackles. Normal respiratory effort. No accessory muscle use.  Cardiovascular: Regular rate and rhythm, no murmurs / rubs / gallops. No extremity edema. 2+ pedal pulses. No carotid bruits.  Abdomen: no tenderness, no masses palpated. No hepatosplenomegaly. Bowel sounds positive.  Musculoskeletal: no clubbing / cyanosis. No joint deformity upper and lower extremities. Good ROM, no contractures. Normal muscle tone.  Skin: no rashes, lesions, ulcers. No induration Neurologic: CN 2-12 grossly intact. Sensation intact, DTR normal. Strength 5/5 UE. Left lower extremity is ext rotated . Psychiatric: Normal judgment and insight. Alert and oriented x 3. Normal mood.   Labs on Admission: I have personally reviewed following labs and imaging studies  CBC: Recent Labs  Lab 10/29/19 0931  WBC 12.1*  NEUTROABS 10.0*  HGB 10.9*  HCT 33.2*  MCV 86.0  PLT [id number]   Basic Metabolic Panel: Recent Labs  Lab 10/29/19 0931  NA 135  K 3.8  CL 103  CO2 23  GLUCOSE 123*  BUN 27*  CREATININE 0.92  CALCIUM 8.9   GFR: Estimated Creatinine Clearance: 28.6 mL/min (by C-G formula based on SCr of 0.92 mg/dL). Liver Function Tests: No results for input(s): AST, ALT, ALKPHOS, BILITOT, PROT, ALBUMIN in the last 168 hours. No results for input(s): LIPASE, AMYLASE in  the last 168 hours. No results for input(s): AMMONIA in the last 168 hours. Coagulation Profile: No results for input(s): INR, PROTIME in the last 168 hours. Cardiac Enzymes: No results for input(s): CKTOTAL, CKMB, CKMBINDEX, TROPONINI in the last 168 hours. BNP (last 3 results) No results for input(s): PROBNP in the last 8760 hours. HbA1C: No results for input(s): HGBA1C in the last 72 hours. CBG: No results for input(s): GLUCAP in the last 168 hours. Lipid Profile: No results for input(s): CHOL, HDL, LDLCALC, TRIG, CHOLHDL, LDLDIRECT in the last 72 hours. Thyroid Function Tests: No results for input(s): TSH, T4TOTAL, FREET4, T3FREE, THYROIDAB in the last 72 hours. Anemia Panel: No results for input(s): VITAMINB12, FOLATE, FERRITIN, TIBC, IRON, RETICCTPCT in the last 72 hours. Urine  analysis:    Component Value Date/Time   COLORURINE STRAW (A) 07/29/2019 1815   APPEARANCEUR HAZY (A) 07/29/2019 1815   LABSPEC 1.010 07/29/2019 1815   PHURINE 8.0 07/29/2019 1815   GLUCOSEU NEGATIVE 07/29/2019 1815   HGBUR NEGATIVE 07/29/2019 1815   BILIRUBINUR NEGATIVE 07/29/2019 1815   KETONESUR 5 (A) 07/29/2019 1815   PROTEINUR NEGATIVE 07/29/2019 1815   NITRITE NEGATIVE 07/29/2019 1815   LEUKOCYTESUR NEGATIVE 07/29/2019 1815    Radiological Exams on Admission: Ct Cervical Spine Wo Contrast  Result Date: 10/29/2019 CLINICAL DATA:  Status post fall last night transferring from a wheelchair to a chair. Initial encounter. EXAM: CT CERVICAL SPINE WITHOUT CONTRAST TECHNIQUE: Multidetector CT imaging of the cervical spine was performed without intravenous contrast. Multiplanar CT image reconstructions were also generated. COMPARISON:  None. FINDINGS: Alignment: Facet degenerative change results in trace anterolisthesis C4 on C5, 0.2 cm anterolisthesis C6 on C7 and C7 on T1. No traumatic listhesis. Skull base and vertebrae: No acute fracture. No primary bone lesion or focal pathologic process. Soft  tissues and spinal canal: No prevertebral fluid or swelling. No visible canal hematoma. Atherosclerotic vascular disease is noted. Disc levels: Loss of disc space height is worst at C3-4, C5-6 and C6-7. Advanced multilevel facet degenerative change is also present. Upper chest: Lung apices are clear. Other: None. IMPRESSION: No acute abnormality. Advanced multilevel degenerative change. Electronically Signed   By: Inge Rise M.D.   On: 10/29/2019 10:04   Dg Chest Portable 1 View  Result Date: 10/29/2019 CLINICAL DATA:  Left hip fracture. EXAM: PORTABLE CHEST 1 VIEW COMPARISON:  March 31, 2019. FINDINGS: The heart size and mediastinal contours are within normal limits. Both lungs are clear. The visualized skeletal structures are unremarkable. IMPRESSION: No active disease. Electronically Signed   By: Marijo Conception M.D.   On: 10/29/2019 10:40   Dg Hip Unilat W Or Wo Pelvis 2-3 Views Left  Result Date: 10/29/2019 CLINICAL DATA:  Left hip pain after fall last night. EXAM: DG HIP (WITH OR WITHOUT PELVIS) 2-3V LEFT COMPARISON:  None. FINDINGS: Moderately displaced fracture is seen involving the proximal left femoral neck. Severe degenerative changes seen involving the right hip with prior surgical internal fixation of the proximal right femur. IMPRESSION: Moderately displaced proximal left femoral neck fracture. Electronically Signed   By: Marijo Conception M.D.   On: 10/29/2019 10:39    EKG: Independently reviewed.   Assessment/Plan Active Problems:   Closed left hip fracture, initial encounter [facility])   Essential hypertension   Near syncope   Degeneration macular   Disease of thyroid gland    Closed left hip fracture: Pt has sx today and ortho consult was requested by ed provider and pt is scheduled for surgery today. Npo and heparin held. Although this sounds like a mechanical fall . I would suspect that pt needs syncope workup once stable and discharged.   HTN: Continue her home  regimen with atenolol.   Near syncope/syncope: 2 d echo. Titrate bp meds to avoid orthostatic changes .  Macular degeneration/glaucoma: Cont home MVI.  Disease of the thyroid gland: FT4/ tsh.   DVT prophylaxis: SCD Heparin and to be held after midnight.  Code Status: full code  Family Communication: None at bedside and son is Emergency contact name is Ed. Disposition Plan: SNF VS STR Consults called: Orthopedic Dr.Durani. Admission status: Inpatient.   Para Skeans MD [facility]  If 7PM-7AM, please contact night-coverage [email or web address] Password [facility]  10/29/2019, 12:36 PM

## 2019-10-29 NOTE — Anesthesia Preprocedure Evaluation (Addendum)
Anesthesia Evaluation  Patient identified by MRN, date of birth, ID band Patient awake    Reviewed: Allergy & Precautions, H&P , NPO status , Patient's Chart, lab work & pertinent test results, reviewed documented beta blocker date and time   History of Anesthesia Complications Negative for: history of anesthetic complications  Airway Mallampati: II   Neck ROM: full    Dental  (+) Poor Dentition   Pulmonary neg pulmonary ROS, former smoker,           Cardiovascular Exercise Tolerance: Poor hypertension, On Medications and Pt. on medications (-) Past MI and (-) CABG Normal cardiovascular exam(-) dysrhythmias (-) Valvular Problems/Murmurs     Neuro/Psych neg Seizures Dementia    GI/Hepatic Neg liver ROS, GERD  Medicated and Controlled,  Endo/Other  neg diabetes  Renal/GU negative Renal ROS  negative genitourinary   Musculoskeletal   Abdominal   Peds  Hematology   Anesthesia Other Findings     Reproductive/Obstetrics negative OB ROS                            Anesthesia Physical  Anesthesia Plan  ASA: III and emergent  Anesthesia Plan: Spinal   Post-op Pain Management:    Induction:   PONV Risk Score and Plan: Propofol infusion and TIVA  Airway Management Planned: Natural Airway and Simple Face Mask  Additional Equipment:   Intra-op Plan:   Post-operative Plan:   Informed Consent: I have reviewed the patients History and Physical, chart, labs and discussed the procedure including the risks, benefits and alternatives for the proposed anesthesia with the patient or authorized representative who has indicated his/her understanding and acceptance.       Plan Discussed with: CRNA  Anesthesia Plan Comments:        Anesthesia Quick Evaluation

## 2019-10-29 NOTE — Progress Notes (Signed)
Patient desat into the 80's placed on non rebreather, notified MD of patient condition

## 2019-10-29 NOTE — ED Notes (Signed)
Spoke with RN from [place].  Will give atenolol and hold other PO meds as pt will be going shortly to surgery.

## 2019-10-29 NOTE — Op Note (Signed)
OPERATIVE NOTE  DATE OF SURGERY:  10/29/2019  PATIENT NAME:  Brittany Blackwell   DOB: 04/14/1918  MRN: [id number]  PRE-OPERATIVE DIAGNOSIS: Left femoral neck fracture  POST-OPERATIVE DIAGNOSIS:  Same  PROCEDURE: Left hip hemiarthroplasty  SURGEON:  Creig Hines, M.D.  ANESTHESIA: Spinal anesthesia  ESTIMATED BLOOD LOSS: 100 mL  FLUIDS REPLACED: 500 mL of crystalloid  DRAINS:  IMPLANTS UTILIZED:  1. [employer] Accolade Stem noncemented Size 5 2. Femoral Head bipolar 47 mm, -4 mm  INDICATIONS FOR SURGERY: Brittany Blackwell is a 85 y.o. year old female who fell and sustained a displaced left femoral neck fracture. After discussion of the risks and benefits of surgical intervention, the patient expressed understanding of the risks benefits and agree with plans for hip hemiarthroplasty.  Patient with prior history of valgus impacted right femoral neck fracture and underwent percutaneous screw fixation in April of this year with good outcome.  Before the recent fall patient was ambulatory using a walker.  The risks, benefits, and alternatives were discussed at length including but not limited to the risks of infection, bleeding, nerve injury, stiffness, blood clots, the need for revision surgery, limb length inequality, dislocation, cardiopulmonary complications, among others, and they were willing to proceed.  PROCEDURE IN DETAIL: The patient was brought into the operating room and, after adequate spinal anesthesia was achieved, patient was placed supine on the anterior hip table.  The patient's left hip was cleaned and prepped with alcohol and DuraPrep and draped in the usual sterile fashion. A "timeout" was performed as per usual protocol.  A direct anterior approach was used starting with an incision 3 cm distal and lateral to the anterior superior iliac spine.  Skin and deep fascia was incised.  A longitudinal incision was made in the tensor fascia lata.  The tensor muscle was retracted  laterally.  Anterior hip capsule was exposed by placement of 4 retractors including superior inferior to the neck as well as anterior retractor over the pelvic brim.  A T-shaped capsulectomy was performed.  Femoral neck fracture was identified and the femoral head was removed.  The head was sized at..   Attention was then directed to the proximal femur.  Using a cookie cutter and entrance was created for the broach.  Progressive reaming was done up to size 5 reamer.  We then trialed with different size head and neck option and found adequate stability with size 47 head with -4 mm neck.  We removed the trial components and irrigated the femoral canal.  We then inserted a noncemented size 5 femoral stem and then placed a 47 mm, -4 mm bipolar head.  Using AP pelvis x-rays limb lengths were compared and were equal.  The wound was irrigated with copious amounts of normal saline with antibiotic solution and suctioned dry. Good hemostasis was appreciated.  The wound was closed in layers with closure of the fascia for the tensor fascia lata followed by closure of the subcutaneous tissue.  Skin was closed with staples.  The patient tolerated the procedure well and was transported to the recovery room in stable condition.   Creig Hines, M.D.

## 2019-10-29 NOTE — Consult Note (Signed)
ORTHOPAEDIC CONSULTATION  PATIENT NAME: Brittany Blackwell DOB: 01-Jun-1918  MRN: [id number]  REQUESTING PHYSICIAN: Para Skeans, MD  Chief Complaint: Displaced left femoral neck fracture  HPI: Brittany Blackwell is a 85 y.o. female who complains of left hip pain after a fall at the nursing home.  Patient was brought to the ER at [facility].  The orthopedic service was consulted for displaced left femoral neck fracture.  Patient apparently was ambulatory using a walker before the fall.  She has prior history of right femoral neck valgus impacted fracture that was treated with percutaneous screw fixation.  Past Medical History:  Diagnosis Date  . Acid reflux   . Glaucoma   . Hypertension   . Macular degeneration   . Thyroid disease    Past Surgical History:  Procedure Laterality Date  . ABDOMINAL HYSTERECTOMY    . HIP PINNING,CANNULATED Right 04/01/2019   Procedure: CANNULATED HIP PINNING;  Surgeon: Lovell Sheehan, MD;  Location: [facility] ORS;  Service: Orthopedics;  Laterality: Right;  . TONSILLECTOMY     Social History   Socioeconomic History  . Marital status: Widowed    Spouse name: Not on file  . Number of children: 1  . Years of education: Not on file  . Highest education level: Not on file  Occupational History  . Not on file  Social Needs  . Financial resource strain: Not on file  . Food insecurity    Worry: Not on file    Inability: Not on file  . Transportation needs    Medical: Not on file    Non-medical: Not on file  Tobacco Use  . Smoking status: Former Research scientist (life sciences)  . Smokeless tobacco: Never Used  . Tobacco comment: quit many years ago.  Substance and Sexual Activity  . Alcohol use: No  . Drug use: No  . Sexual activity: Not on file  Lifestyle  . Physical activity    Days per week: Not on file    Minutes per session: Not on file  . Stress: Not on file  Relationships  . Social Herbalist on phone: Not on file    Gets together: Not on  file    Attends religious service: Not on file    Active member of club or organization: Not on file    Attends meetings of clubs or organizations: Not on file    Relationship status: Not on file  Other Topics Concern  . Not on file  Social History Narrative  . Not on file   Family History  Problem Relation Age of Onset  . Breast cancer Mother   . Heart attack Father    Allergies  Allergen Reactions  . Demerol [Meperidine] Other (See Comments)    BP drops   Prior to Admission medications   Medication Sig Start Date End Date Taking? Authorizing Provider  acetaminophen (TYLENOL) 325 MG tablet Take 2 tablets (650 mg total) by mouth every 6 (six) hours as needed for mild pain (or Fever >/= 101). 04/03/19  Yes Dustin Flock, MD  amLODipine (NORVASC) 10 MG tablet Take 10 mg by mouth daily.    Yes [provider]  atenolol (TENORMIN) 50 MG tablet Take 50 mg by mouth daily.   Yes [provider]  budesonide (ENTOCORT EC) 3 MG 24 hr capsule Take 9 mg by mouth daily.  02/12/19  Yes [provider]  latanoprost (XALATAN) 0.005 % ophthalmic solution Place 1 drop into both  eyes at bedtime.  03/22/19  Yes [provider]  Multiple Vitamins-Minerals (PRESERVISION AREDS 2+MULTI VIT PO) Take 1 tablet by mouth 2 (two) times daily.    Yes [provider]  pantoprazole (PROTONIX) 40 MG tablet Take 1 tablet (40 mg total) by mouth daily. 07/21/19  Yes Vaughan Basta, MD  thyroid [facility] THYROID) 90 MG tablet Take 90 mg by mouth daily.   Yes [provider]  timolol (TIMOPTIC) 0.5 % ophthalmic solution Place 1 drop into both eyes daily.  03/22/19  Yes [provider]   Ct Cervical Spine Wo Contrast  Result Date: 10/29/2019 CLINICAL DATA:  Status post fall last night transferring from a wheelchair to a chair. Initial encounter. EXAM: CT CERVICAL SPINE WITHOUT CONTRAST TECHNIQUE: Multidetector CT imaging of the cervical spine was performed  without intravenous contrast. Multiplanar CT image reconstructions were also generated. COMPARISON:  None. FINDINGS: Alignment: Facet degenerative change results in trace anterolisthesis C4 on C5, 0.2 cm anterolisthesis C6 on C7 and C7 on T1. No traumatic listhesis. Skull base and vertebrae: No acute fracture. No primary bone lesion or focal pathologic process. Soft tissues and spinal canal: No prevertebral fluid or swelling. No visible canal hematoma. Atherosclerotic vascular disease is noted. Disc levels: Loss of disc space height is worst at C3-4, C5-6 and C6-7. Advanced multilevel facet degenerative change is also present. Upper chest: Lung apices are clear. Other: None. IMPRESSION: No acute abnormality. Advanced multilevel degenerative change. Electronically Signed   By: Inge Rise M.D.   On: 10/29/2019 10:04   Dg Chest Portable 1 View  Result Date: 10/29/2019 CLINICAL DATA:  Left hip fracture. EXAM: PORTABLE CHEST 1 VIEW COMPARISON:  March 31, 2019. FINDINGS: The heart size and mediastinal contours are within normal limits. Both lungs are clear. The visualized skeletal structures are unremarkable. IMPRESSION: No active disease. Electronically Signed   By: Marijo Conception M.D.   On: 10/29/2019 10:40   Dg Hip Unilat W Or Wo Pelvis 2-3 Views Left  Result Date: 10/29/2019 CLINICAL DATA:  Left hip pain after fall last night. EXAM: DG HIP (WITH OR WITHOUT PELVIS) 2-3V LEFT COMPARISON:  None. FINDINGS: Moderately displaced fracture is seen involving the proximal left femoral neck. Severe degenerative changes seen involving the right hip with prior surgical internal fixation of the proximal right femur. IMPRESSION: Moderately displaced proximal left femoral neck fracture. Electronically Signed   By: Marijo Conception M.D.   On: 10/29/2019 10:39    Positive ROS: All other systems have been reviewed and were otherwise negative with the exception of those mentioned in the HPI and as above.  Physical  Exam: General: Well developed, well nourished female seen in no acute distress. HEENT: Atraumatic and normocephalic. Sclera are clear. Extraocular motion is intact. Oropharynx is clear with moist mucosa. Neck: Supple, nontender, good range of motion. No JVD or carotid bruits. Lungs: Clear to auscultation bilaterally. Cardiovascular: Regular rate and rhythm with normal S1 and S2. No murmurs. No gallops or rubs. Pedal pulses are palpable bilaterally. Homans test is negative bilaterally. No significant pretibial or ankle edema. Abdomen: Soft, nontender, and nondistended. Bowel sounds are present. Skin: No lesions in the area of chief complaint Neurologic: Awake, alert, and oriented. Sensory function is grossly intact. Motor strength is felt to be 5 over 5 bilaterally. No clonus or tremor. Good motor coordination. Lymphatic: No axillary or cervical lymphadenopathy  MUSCULOSKELETAL: Left lower extremity is shortened and in external rotation.  She has pain with logroll.  Assessment:  85 years old female with displaced left femoral neck fracture  Plan: 85 years old female nursing home resident status post fall with displaced left femoral neck fracture.  Patient was ambulatory before the fall using a walker.  I had a discussion with the patient and her son.  Based on the nature of the displaced femoral neck fracture as well as the fact that patient was ambulatory before this injury using a walker patient will be scheduled for left hip hemiarthroplasty.  The procedure was explained as well as the postoperative recovery process as well as the risks involved in the procedure.  Creig Hines, M.D.

## 2019-10-29 NOTE — ED Provider Notes (Signed)
[facility] Emergency Department Provider Note ____________________________________________   First MD Initiated Contact with Patient 10/29/19 [phone]     (approximate)  I have reviewed the triage vital signs and the nursing notes.   HISTORY  Chief Complaint Hip Injury    HPI Brittany Blackwell is a 85 y.o. female with PMH as noted below who presents with left hip pain after a fall last night.  The patient reports mechanical fall while she was walking.  She states that she has been unable to move her left leg or bear weight on it.  She states that she is not totally sure if she hit her head but has no headache.  She does report some neck pain but no back pain.  X-ray at her facility showed a hip fracture on the left.  Past Medical History:  Diagnosis Date  . Acid reflux   . Glaucoma   . Hypertension   . Macular degeneration   . Thyroid disease     Patient Active Problem List   Diagnosis Date Noted  . Closed left hip fracture, initial encounter ([place]) 10/29/2019  . Dizziness 07/19/2019  . Near syncope 07/18/2019  . Closed right hip fracture ([place]) 03/31/2019  . Chicken pox 01/03/2016  . Essential hypertension 01/03/2016  . Degeneration macular 01/03/2016  . Disease of thyroid gland 01/03/2016    Past Surgical History:  Procedure Laterality Date  . ABDOMINAL HYSTERECTOMY    . HIP PINNING,CANNULATED Right 04/01/2019   Procedure: CANNULATED HIP PINNING;  Surgeon: Lovell Sheehan, MD;  Location: [facility] ORS;  Service: Orthopedics;  Laterality: Right;  . TONSILLECTOMY      Prior to Admission medications   Medication Sig Start Date End Date Taking? Authorizing Provider  acetaminophen (TYLENOL) 325 MG tablet Take 2 tablets (650 mg total) by mouth every 6 (six) hours as needed for mild pain (or Fever >/= 101). 04/03/19  Yes Dustin Flock, MD  amLODipine (NORVASC) 10 MG tablet Take 10 mg by mouth daily.    Yes [provider]  atenolol (TENORMIN) 50 MG  tablet Take 50 mg by mouth daily.   Yes [provider]  budesonide (ENTOCORT EC) 3 MG 24 hr capsule Take 9 mg by mouth daily.  02/12/19  Yes [provider]  latanoprost (XALATAN) 0.005 % ophthalmic solution Place 1 drop into both eyes at bedtime.  03/22/19  Yes [provider]  Multiple Vitamins-Minerals (PRESERVISION AREDS 2+MULTI VIT PO) Take 1 tablet by mouth 2 (two) times daily.    Yes [provider]  pantoprazole (PROTONIX) 40 MG tablet Take 1 tablet (40 mg total) by mouth daily. 07/21/19  Yes Vaughan Basta, MD  thyroid [facility] THYROID) 90 MG tablet Take 90 mg by mouth daily.   Yes [provider]  timolol (TIMOPTIC) 0.5 % ophthalmic solution Place 1 drop into both eyes daily.  03/22/19  Yes [provider]    Allergies Demerol [meperidine]  Family History  Problem Relation Age of Onset  . Breast cancer Mother   . Heart attack Father     Social History Social History   Tobacco Use  . Smoking status: Former Research scientist (life sciences)  . Smokeless tobacco: Never Used  . Tobacco comment: quit many years ago.  Substance Use Topics  . Alcohol use: No  . Drug use: No    Review of Systems  Constitutional: No fever. Eyes: No redness. ENT: No sore throat. Cardiovascular: Denies chest pain. Respiratory: Denies shortness of breath. Gastrointestinal: No  vomiting or diarrhea.  Genitourinary: Negative for dysuria.  Musculoskeletal: Negative for back pain.  Positive for left hip pain. Skin: Negative for rash. Neurological: Negative for headache.   ____________________________________________   PHYSICAL EXAM:  VITAL SIGNS: ED Triage Vitals  Enc Vitals Group     BP 10/29/19 0915 (!) 160/75     Pulse Rate 10/29/19 0915 76     Resp 10/29/19 0915 16     Temp 10/29/19 0915 98.7 F (37.1 C)     Temp Source 10/29/19 0915 Oral     SpO2 10/29/19 0915 90 %     Weight 10/29/19 0917 120 lb (54.4 kg)     Height 10/29/19 0917 5\' 5"  (1.651 m)      Head Circumference --      Peak Flow --      Pain Score 10/29/19 0916 5     Pain Loc --      Pain Edu? --      Excl. in [place]? --     Constitutional: Alert and oriented. Well appearing for age and in no acute distress. Eyes: Conjunctivae are normal.  EOMI. Head: Atraumatic. Nose: No congestion/rhinnorhea. Mouth/Throat: Mucous membranes are moist.   Neck: Normal range of motion.  Cardiovascular: Normal rate, regular rhythm. Good peripheral circulation. Respiratory: Normal respiratory effort.  No retractions.  Gastrointestinal: Soft and nontender. No distention.  Genitourinary: No flank tenderness. Musculoskeletal: 1+ bilateral lower extremity edema.  Extremities warm and well perfused.  Tenderness to the left groin.  No midline spinal tenderness.  2+ DP pulses to bilateral lower extremities. Neurologic:  Normal speech and language. No gross focal neurologic deficits are appreciated.  Skin:  Skin is warm and dry. No rash noted. Psychiatric: Mood and affect are normal. Speech and behavior are normal.  ____________________________________________   LABS (all labs ordered are listed, but only abnormal results are displayed)  Labs Reviewed  BASIC METABOLIC PANEL - Abnormal; Notable for the following components:      Result Value   Glucose, Bld 123 (*)    BUN 27 (*)    GFR calc non Af Amer 51 (*)    GFR calc Af Amer 60 (*)    All other components within normal limits  CBC WITH DIFFERENTIAL/PLATELET - Abnormal; Notable for the following components:   WBC 12.1 (*)    RBC 3.86 (*)    Hemoglobin 10.9 (*)    HCT 33.2 (*)    Neutro Abs 10.0 (*)    Abs Immature Granulocytes 0.08 (*)    All other components within normal limits  SARS CORONAVIRUS 2 BY RT PCR (HOSPITAL ORDER, [place] [facility])   ____________________________________________  EKG  ED ECG REPORT I, Arta Silence, the attending physician, personally viewed and interpreted this ECG.  Date:  10/29/2019 EKG Time: 0938 Rate: 75 Rhythm: normal sinus rhythm QRS Axis: normal Intervals: normal ST/T Wave abnormalities: normal Narrative Interpretation: no evidence of acute ischemia  ____________________________________________  RADIOLOGY  CXR: No acute abnormality XR L hip: Femoral neck fracture CT cervical spine: No acute fracture  ____________________________________________   PROCEDURES  Procedure(s) performed: No  Procedures  Critical Care performed: No ____________________________________________   INITIAL IMPRESSION / ASSESSMENT AND PLAN / ED COURSE  Pertinent labs & imaging results that were available during my care of the patient were reviewed by me and considered in my medical decision making (see chart for details).  85 year old female with PMH as noted above presents from her facility with left hip  pain after a fall, with hip fracture diagnosed on x-ray there.  The patient also reports some neck pain.  She is unsure if she hit her head but has no headache.  Based on her description, this appears to be a mechanical fall while transferring from a wheelchair to a chair.  The patient normally walks with a walker.  On exam she is very well-appearing for her age.  Her vital signs are normal except for hypertension.  She has pain on movement of the left hip.  There is no significant midline tenderness.  Neurologic exam is nonfocal.  I reviewed the past medical records in Epic.  Although there is a written report available from the patient's facility from the x-ray that she had today, I cannot obtain the images, so therefore we will obtain an x-ray here.  In addition I will obtain a CT of the cervical spine due to her pain there.  I will plan to consult orthopedics and admit the patient.  ----------------------------------------- 3:03 PM on 10/29/2019 -----------------------------------------  CT cervical spine is negative, and the hip x-ray confirms a femoral  neck fracture.  I consulted Dr. Donivan Scull from orthopedics who will come to evaluate the patient.  I signed the patient out to the hospitalist Dr. Posey Pronto for admission. __________________________  Brittany Blackwell was evaluated in Emergency Department on 10/29/2019 for the symptoms described in the history of present illness. She was evaluated in the context of the global COVID-19 pandemic, which necessitated consideration that the patient might be at risk for infection with the SARS-CoV-2 virus that causes COVID-19. Institutional protocols and algorithms that pertain to the evaluation of patients at risk for COVID-19 are in a state of rapid change based on information released by regulatory bodies including the CDC and federal and state organizations. These policies and algorithms were followed during the patient's care in the ED.  ____________________________________________   FINAL CLINICAL IMPRESSION(S) / ED DIAGNOSES  Final diagnoses:  Closed fracture of neck of left femur, initial encounter ([place])      NEW MEDICATIONS STARTED DURING THIS VISIT:  Current Discharge Medication List       Note:  This document was prepared using Dragon voice recognition software and may include unintentional dictation errors.   Arta Silence, MD 10/29/19 [phone]

## 2019-10-29 NOTE — ED Triage Notes (Signed)
Pt to ED by [facility] from [facility] with confirmed left hip fracture. Pt was transferring from her wheelchair to a chair and fell last night. Xray was ordered and resulted this morning confirming fracture of left hip. Pt denies hitting head, dizziness or lightheadedness prior to fall.

## 2019-10-30 ENCOUNTER — Inpatient Hospital Stay: Payer: PPO

## 2019-10-30 DIAGNOSIS — J9621 Acute and chronic respiratory failure with hypoxia: Secondary | ICD-10-CM

## 2019-10-30 DIAGNOSIS — I10 Essential (primary) hypertension: Secondary | ICD-10-CM

## 2019-10-30 DIAGNOSIS — E039 Hypothyroidism, unspecified: Secondary | ICD-10-CM

## 2019-10-30 DIAGNOSIS — S72002S Fracture of unspecified part of neck of left femur, sequela: Secondary | ICD-10-CM

## 2019-10-30 DIAGNOSIS — H409 Unspecified glaucoma: Secondary | ICD-10-CM

## 2019-10-30 LAB — CBC
HCT: 29.3 % — ABNORMAL LOW (ref 36.0–46.0)
Hemoglobin: 9.2 g/dL — ABNORMAL LOW (ref 12.0–15.0)
MCH: 28.2 pg (ref 26.0–34.0)
MCHC: 31.4 g/dL (ref 30.0–36.0)
MCV: 89.9 fL (ref 80.0–100.0)
Platelets: 161 10*3/uL (ref 150–400)
RBC: 3.26 MIL/uL — ABNORMAL LOW (ref 3.87–5.11)
RDW: 15.7 % — ABNORMAL HIGH (ref 11.5–15.5)
WBC: 10.2 10*3/uL (ref 4.0–10.5)
nRBC: 0 % (ref 0.0–0.2)

## 2019-10-30 MED ORDER — FUROSEMIDE 10 MG/ML IJ SOLN
20.0000 mg | Freq: Once | INTRAMUSCULAR | Status: AC
  Administered 2019-10-30: 20 mg via INTRAVENOUS
  Filled 2019-10-30: qty 4

## 2019-10-30 MED ORDER — ENOXAPARIN SODIUM 30 MG/0.3ML ~~LOC~~ SOLN
30.0000 mg | SUBCUTANEOUS | Status: DC
  Administered 2019-10-30 – 2019-11-01 (×3): 30 mg via SUBCUTANEOUS
  Filled 2019-10-30 (×3): qty 0.3

## 2019-10-30 NOTE — Progress Notes (Signed)
PT Cancellation Note  Patient Details Name: Brittany Blackwell MRN: [id number] DOB: June 19, 1918   Cancelled Treatment:    Reason Eval/Treat Not Completed: Patient declined, no reason specified.  Pt less alert this p.m. and refusing bed exercises and repositioning.  Nasal cannula removed from proper position and PT replaced it and monitored O2 until it increased from 88% to 91%.  Pt stated pain and fatigue was the reason she did not feel like participating and, when PT asked if she could assist with centering pt in the bed and arranging pillows, pt stated "I don't care if I'm leaning".  PT discussed importance of frequent movement and attempted to assist pt with supine there ex to which pt resisted.  Will attempt again in the morning.  Roxanne Gates, PT, DPT  Roxanne Gates 10/30/2019, 3:07 PM

## 2019-10-30 NOTE — Evaluation (Signed)
Occupational Therapy Evaluation Patient Details Name: Brittany Blackwell MRN: [id number] DOB: 1918-01-31 Today's Date: 10/30/2019    History of Present Illness Pt is a 85 year old female admitted s/p L hip hemiarthroplasty following a fall from her WC.  PMH includes macular degeneration, glaucoma, multiple falls and R hip ORIF in April 2020.   Clinical Impression   Pt is very pleasant 85 yrs old lady willing to work with OT - pt show decrease sitting and standing balance - increase pain with movement at L hip and bilateral calves and increase fear. Initially pusher with supine to sit - but after some cueing for hand placement - able to maintain sitting balance with bil hand support - pt show decrease functional mobility and UB and LB ADL 's - pt can benefit from OT services.     Follow Up Recommendations  SNF    Equipment Recommendations  3 in 1 bedside commode    Recommendations for Other Services       Precautions / Restrictions Precautions Precautions: Fall;Anterior Hip Restrictions Weight Bearing Restrictions: Yes LLE Weight Bearing: Weight bearing as tolerated      Mobility Bed Mobility Overal bed mobility: Needs Assistance Bed Mobility: Supine to Sit;Sit to Supine     Supine to sit: Max assist;+2 for physical assistance Sit to supine: Max assist;+2 for physical assistance   General bed mobility comments: Cues to "reach for therapist in front of you" and "lean forward" required throughout getting to EOB.  Pt very fearful and gripping bed rail.  Manual assistance provided for hand placement.  Pt eventually able to sit up with supervision.  Max +2 to return to bed and scoot up.  Transfers Overall transfer level: Needs assistance Equipment used: Rolling walker (2 wheeled) Transfers: Sit to/from Stand Sit to Stand: +2 physical assistance;Mod assist;From elevated surface         General transfer comment: initially pt pusher - but with cues to put hands on knees of PT  sitting infront of her , or her own knees - pt able to maintain sitting - and with  sit to stand - push up L hand on bed , R hand on cross bar of walker- hard time shifting L hand to walker    Balance Overall balance assessment: Needs assistance Sitting-balance support: Bilateral upper extremity supported;Feet supported Sitting balance-Leahy Scale: Fair     Standing balance support: Bilateral upper extremity supported Standing balance-Leahy Scale: Poor Standing balance comment: Relies on RW and maintains narrow BOS, very fearful of falling.                           ADL either performed or assessed with clinical judgement   ADL                                         General ADL Comments: Eating and grooming Independent with setup , UB ADL's Mod A because of balance and max for LB dressing /bathing     Vision Baseline Vision/History: Wears glasses;Macular Degeneration       Perception     Praxis      Pertinent Vitals/Pain Pain Assessment: (supine no pain - but upone moving increase greatly - but no nr stated) Faces Pain Scale: Hurts even more Pain Location: L hip and bilateral lower legs to touch. Pain Descriptors / Indicators: Grimacing;Guarding;Moaning  Pain Intervention(s): Limited activity within patient's tolerance;Monitored during session;Premedicated before session     Hand Dominance Right   Extremity/Trunk Assessment Upper Extremity Assessment Upper Extremity Assessment: (Bilateral UE elbow to hand WNL , bilateral shoulder decrease ROM L worse than R)   Lower Extremity Assessment Lower Extremity Assessment: Generalized weakness(Strength: bilat ankles: PF: 4-/5, SLR: 50% on R,  pain limited on L.  Able to perform quad sets bilat but pain limited due to weeping sores and pitting edema.)   Cervical / Trunk Assessment Cervical / Trunk Assessment: Kyphotic   Communication Communication Communication: No difficulties   Cognition  Arousal/Alertness: Awake/alert Behavior During Therapy: WFL for tasks assessed/performed;Anxious Overall Cognitive Status: Within Functional Limits for tasks assessed                                 General Comments: Follows directions consistently.  Responds well to tactile cues and cues for deep breathing exercises.   General Comments       Exercises     Shoulder Instructions      Home Living Family/patient expects to be discharged to:: Skilled nursing facility                                 Additional Comments: pt states she does not use O2 at home.       Prior Functioning/Environment Level of Independence: Needs assistance  Gait / Transfers Assistance Needed: Ambulates short household distances with RW and uses WC. ADL's / Homemaking Assistance Needed: Hx obtained from pt: "They help me whenever I need it."   Comments: Pt report getting help for dressing and bathing if she needs it only -        OT Problem List: Decreased strength;Decreased knowledge of use of DME or AE;Decreased range of motion;Decreased activity tolerance;Impaired UE functional use;Pain;Impaired balance (sitting and/or standing)      OT Treatment/Interventions: Self-care/ADL training;Patient/family education;Balance training;Therapeutic activities;DME and/or AE instruction    OT Goals(Current goals can be found in the care plan section) Acute Rehab OT Goals Patient Stated Goal: Return to [facility] OT Goal Formulation: With patient Time For Goal Achievement: 11/13/19  OT Frequency: Min 2X/week   Barriers to D/C:            Co-evaluation   Reason for Co-Treatment: Complexity of the patient's impairments (multi-system involvement);To address functional/ADL transfers;Necessary to address cognition/behavior during functional activity PT goals addressed during session: Mobility/safety with mobility;Balance;Proper use of DME;Strengthening/ROM        AM-PAC OT "6  Clicks" Daily Activity     Outcome Measure Help from another person eating meals?: None Help from another person taking care of personal grooming?: None Help from another person toileting, which includes using toliet, bedpan, or urinal?: A Lot Help from another person bathing (including washing, rinsing, drying)?: A Lot Help from another person to put on and taking off regular upper body clothing?: A Lot Help from another person to put on and taking off regular lower body clothing?: A Lot 6 Click Score: 16   End of Session Equipment Utilized During Treatment: Gait belt;Rolling walker  Activity Tolerance: Patient tolerated treatment well;Patient limited by pain Patient left: in bed;with call bell/phone within reach;with bed alarm set  OT Visit Diagnosis: History of falling (Z91.81);Repeated falls (R29.6);Muscle weakness (generalized) (M62.81);Pain Pain - Right/Left: Left(bilateral calfs) Pain - part of body: Leg  Time: [id number] OT Time Calculation (min): 27 min Charges:  OT General Charges $OT Visit: 1 Visit OT Evaluation $OT Eval Moderate Complexity: 1 Mod  GOAL 1:  Pt's sitting balance improve to supervision for 10 min to do LB dressing   1 Week GOAL 2 : Pt to be Mod with toilet tranfers using RW   2 Weeks  GOAL 3: Pt to be min A  Wit using AE for LB dressing  2  Weeks   Rosalyn Gess OTR/L,CLT 10/30/2019, 12:32 PM

## 2019-10-30 NOTE — Evaluation (Signed)
Physical Therapy Evaluation Patient Details Name: Brittany Blackwell MRN: [id number] DOB: 03/26/1918 Today's Date: 10/30/2019   History of Present Illness  Pt is a 85 year old female admitted s/p L hip hemiarthroplasty following a fall from her WC.  PMH includes macular degeneration, glaucoma, multiple falls and R hip ORIF in April 2020.  Clinical Impression  Pt is a 85 year old female who lives in ALF and uses a WC and RW for household ambulation. Pt in bed and reporting 0/10 pain at beginning of session.  She presented with pain response to light touch due to pitting edema and visible sores on lower legs. Blood noted on pt's L sock and RN notified.  Pt able to perform SLR, quad sets with R LE and required assistance with L LE due to hip pain.  Pt max A +2 for all bed mobility and required encouragement throughout due to fear of falling, pt gripping PT's hand and bed rail.  She was able to sit at EOB with supervision only once assisted upright.  Bed elevated for STS and Pt Mod +2 to initiate standing, able to stand upright without assistance after that though.  Pt attempted stepping towards that recliner but unable to initiate sliding feet.  Vc's for use of RW, managing WB on L LE and body mechanics for STS provided.  This was a co-evaluation with OT.  Pt will continue to benefit from skilled PT with focus on strength, tolerance to activity, pain management and safe functional mobility.    Follow Up Recommendations SNF    Equipment Recommendations  None recommended by PT    Recommendations for Other Services       Precautions / Restrictions Precautions Precautions: Fall Restrictions Weight Bearing Restrictions: Yes LLE Weight Bearing: Weight bearing as tolerated      Mobility  Bed Mobility Overal bed mobility: Needs Assistance Bed Mobility: Supine to Sit;Sit to Supine     Supine to sit: Max assist;+2 for physical assistance Sit to supine: Max assist;+2 for physical assistance    General bed mobility comments: Cues to "reach for therapist in front of you" and "lean forward" required throughout getting to EOB.  Pt very fearful and gripping bed rail.  Manual assistance provided for hand placement.  Pt eventually able to sit up with supervision.  Max +2 to return to bed and scoot up.  Transfers Overall transfer level: Needs assistance Equipment used: Rolling walker (2 wheeled) Transfers: Sit to/from Stand Sit to Stand: +2 physical assistance;Mod assist;From elevated surface         General transfer comment: Assistance to initiate STS and then pt was able to stand upright without VC's.  OT placed pt's R hand in RW and anchored walker to assist pt, cuing to push from bed with L hand.  VC's for management of WB on R LE and shifting to L side.  Pt able to stand without assistance but unable to advance feet to step toward recliner.  Ambulation/Gait                Stairs            Wheelchair Mobility    Modified Rankin (Stroke Patients Only)       Balance Overall balance assessment: Needs assistance;History of Falls Sitting-balance support: Feet supported;Bilateral upper extremity supported Sitting balance-Leahy Scale: Fair     Standing balance support: Bilateral upper extremity supported Standing balance-Leahy Scale: Poor Standing balance comment: Relies on RW and maintains narrow BOS, very fearful  of falling.                             Pertinent Vitals/Pain Pain Assessment: Faces Faces Pain Scale: Hurts even more Pain Location: L hip and bilateral lower legs to touch. Pain Descriptors / Indicators: Grimacing;Guarding;Moaning Pain Intervention(s): Limited activity within patient's tolerance;Monitored during session;Premedicated before session    Home Living Family/patient expects to be discharged to:: Skilled nursing facility                      Prior Function Level of Independence: Needs assistance   Gait /  Transfers Assistance Needed: Ambulates short household distances with RW and uses WC.  ADL's / Homemaking Assistance Needed: Hx obtained from pt: "They help me whenever I need it."        Hand Dominance   Dominant Hand: Right    Extremity/Trunk Assessment   Upper Extremity Assessment Upper Extremity Assessment: Generalized weakness(BUE:shoulder AROM: 75 degrees, Strength: grip: 4/5, elbow flexion: 4-/5.  No sensation loss reported.)    Lower Extremity Assessment Lower Extremity Assessment: Generalized weakness(Strength: bilat ankles: PF: 4-/5, SLR: 50% on R,  pain limited on L.  Able to perform quad sets bilat but pain limited due to weeping sores and pitting edema.)    Cervical / Trunk Assessment Cervical / Trunk Assessment: Kyphotic  Communication   Communication: No difficulties  Cognition Arousal/Alertness: Awake/alert Behavior During Therapy: WFL for tasks assessed/performed;Anxious Overall Cognitive Status: Within Functional Limits for tasks assessed                                 General Comments: Follows directions consistently.  Responds well to tactile cues and cues for deep breathing exercises.      General Comments      Exercises     Assessment/Plan    PT Assessment Patient needs continued PT services  PT Problem List Decreased strength;Decreased mobility;Decreased activity tolerance;Decreased balance;Decreased knowledge of use of DME       PT Treatment Interventions DME instruction;Therapeutic activities;Gait training;Therapeutic exercise;Patient/family education;Stair training;Balance training;Functional mobility training    PT Goals (Current goals can be found in the Care Plan section)  Acute Rehab PT Goals Patient Stated Goal: To return home and continue to walk with her walker. PT Goal Formulation: With patient Time For Goal Achievement: 11/13/19 Potential to Achieve Goals: Good    Frequency BID   Barriers to discharge         Co-evaluation PT/OT/SLP Co-Evaluation/Treatment: Yes Reason for Co-Treatment: Complexity of the patient's impairments (multi-system involvement);To address functional/ADL transfers;Necessary to address cognition/behavior during functional activity PT goals addressed during session: Mobility/safety with mobility;Balance;Proper use of DME;Strengthening/ROM         AM-PAC PT "6 Clicks" Mobility  Outcome Measure Help needed turning from your back to your side while in a flat bed without using bedrails?: A Lot Help needed moving from lying on your back to sitting on the side of a flat bed without using bedrails?: A Lot Help needed moving to and from a bed to a chair (including a wheelchair)?: A Lot Help needed standing up from a chair using your arms (e.g., wheelchair or bedside chair)?: A Lot Help needed to walk in hospital room?: A Lot Help needed climbing 3-5 steps with a railing? : A Lot 6 Click Score: 12    End of Session Equipment Utilized During  Treatment: Gait belt;Oxygen Activity Tolerance: Patient limited by pain;Patient limited by fatigue Patient left: in bed;with call bell/phone within reach;with bed alarm set Nurse Communication: Mobility status PT Visit Diagnosis: Unsteadiness on feet (R26.81);Muscle weakness (generalized) (M62.81);Pain;History of falling (Z91.81) Pain - Right/Left: Left Pain - part of body: Hip    Time: [id number] PT Time Calculation (min) (ACUTE ONLY): 26 min   Charges:   PT Evaluation $PT Eval Moderate Complexity: 1 Mod          Roxanne Gates, PT, DPT   Roxanne Gates 10/30/2019, 12:04 PM

## 2019-10-30 NOTE — Progress Notes (Signed)
PHARMACIST - PHYSICIAN COMMUNICATION  CONCERNING:  Enoxaparin (Lovenox) for DVT Prophylaxis    RECOMMENDATION: Patient was prescribed enoxaprin 40mg  q24 hours for VTE prophylaxis.   Filed Weights   10/29/19 0917  Weight: 120 lb (54.4 kg)    Body mass index is 19.97 kg/m.  Estimated Creatinine Clearance: 28.6 mL/min (by C-G formula based on SCr of 0.92 mg/dL).   Patient is candidate for enoxaparin 30mg  every 24 hours based on CrCl <59ml/min or Weight less then 45kg for women or <57kg for men   DESCRIPTION: Pharmacy has adjusted enoxaparin dose per [facility] policy.  Patient is now receiving enoxaparin 30mg  every 24 hours.  Ena Dawley, PharmD Clinical Pharmacist  10/30/2019 4:33 AM

## 2019-10-30 NOTE — Anesthesia Postprocedure Evaluation (Signed)
Anesthesia Post Note  Patient: Brittany Blackwell  Procedure(s) Performed: ANTERIOR APPROACH HEMI HIP ARTHROPLASTY (Left Hip)  Patient location during evaluation: Nursing Unit Anesthesia Type: Spinal Level of consciousness: awake Pain management: pain level controlled Vital Signs Assessment: post-procedure vital signs reviewed and stable Respiratory status: spontaneous breathing and patient connected to face mask oxygen Cardiovascular status: blood pressure returned to baseline and stable Postop Assessment: no headache, no backache, no apparent nausea or vomiting and patient able to bend at knees Anesthetic complications: no     Last Vitals:  Vitals:   10/30/19 0152 10/30/19 0459  BP: (!) 155/82 (!) 163/61  Pulse: 88 87  Resp: 20 19  Temp: 37.9 C 37.4 C  SpO2: 100% 100%    Last Pain:  Vitals:   10/30/19 0459  TempSrc: Oral  PainSc:                  [name]

## 2019-10-30 NOTE — Progress Notes (Signed)
Patient ID: Brittany Blackwell, female   DOB: 1918-04-10, 85 y.o.   MRN: [id number] [facility] PROGRESS NOTE  Brittany Blackwell [id number] DOB: [birth year] DOA: 10/29/2019 PCP: [place] Pink, MD  HPI/Subjective: Patient states that she does not feel well but does not elaborate much.  Has pain in the hip.  But cannot tell me how bad.  Does not complain of shortness of breath or cough.  She was placed on 100% nonrebreather last night because she was mouth breathing.  Objective: Vitals:   10/30/19 0459 10/30/19 0758  BP: (!) 163/61 (!) 146/62  Pulse: 87 82  Resp: 19 16  Temp: 99.3 F (37.4 C) 99.5 F (37.5 C)  SpO2: 100% 100%    Intake/Output Summary (Last 24 hours) at 10/30/2019 0818 Last data filed at 10/30/2019 0550 Gross per 24 hour  Intake 1035.23 ml  Output 1550 ml  Net -514.77 ml   Filed Weights   10/29/19 0917  Weight: 54.4 kg    ROS: Review of Systems  Unable to perform ROS: Acuity of condition  Respiratory: Negative for cough and shortness of breath.   Cardiovascular: Negative for chest pain.  Gastrointestinal: Negative for abdominal pain.  Musculoskeletal: Positive for joint pain.   Exam: Physical Exam  HENT:  Nose: No mucosal edema.  Mouth/Throat: No oropharyngeal exudate or posterior oropharyngeal edema.  Eyes: Pupils are equal, round, and reactive to light. Conjunctivae and lids are normal.  Neck: No JVD present. Carotid bruit is not present. No edema present. No thyroid mass and no thyromegaly present.  Cardiovascular: S1 normal and S2 normal. Exam reveals no gallop.  No murmur heard. Pulses:      Dorsalis pedis pulses are 2+ on the right side and 2+ on the left side.  Respiratory: No respiratory distress. She has decreased breath sounds in the right lower field and the left lower field. She has no wheezes. She has no rhonchi. She has no rales.  GI: Soft. Bowel sounds are normal. There is no abdominal tenderness.  Musculoskeletal:     Right ankle: She  exhibits no swelling.     Left ankle: She exhibits no swelling.  Lymphadenopathy:    She has no cervical adenopathy.  Neurological: She is alert.  Answers some yes or no questions but does not elaborate much.  Skin: Skin is warm. Nails show no clubbing.  Chronic lower extremity discoloration  Psychiatric: She has a normal mood and affect.      Data Reviewed: Basic Metabolic Panel: Recent Labs  Lab 10/29/19 0931  NA 135  K 3.8  CL 103  CO2 23  GLUCOSE 123*  BUN 27*  CREATININE 0.92  CALCIUM 8.9   CBC: Recent Labs  Lab 10/29/19 0931  WBC 12.1*  NEUTROABS 10.0*  HGB 10.9*  HCT 33.2*  MCV 86.0  PLT 174     Recent Results (from the past 240 hour(s))  SARS Coronavirus 2 by RT PCR (hospital order, performed in [facility] hospital lab) Nasopharyngeal Nasopharyngeal Swab     Status: None   Collection Time: 10/29/19 11:42 AM   Specimen: Nasopharyngeal Swab  Result Value Ref Range Status   SARS Coronavirus 2 NEGATIVE NEGATIVE Final    Comment: (NOTE) If result is NEGATIVE SARS-CoV-2 target nucleic acids are NOT DETECTED. The SARS-CoV-2 RNA is generally detectable in upper and lower  respiratory specimens during the acute phase of infection. The lowest  concentration of SARS-CoV-2 viral copies this assay can detect is 250  copies / mL.  A negative result does not preclude SARS-CoV-2 infection  and should not be used as the sole basis for treatment or other  patient management decisions.  A negative result may occur with  improper specimen collection / handling, submission of specimen other  than nasopharyngeal swab, presence of viral mutation(s) within the  areas targeted by this assay, and inadequate number of viral copies  (<250 copies / mL). A negative result must be combined with clinical  observations, patient history, and epidemiological information. If result is POSITIVE SARS-CoV-2 target nucleic acids are DETECTED. The SARS-CoV-2 RNA is generally detectable  in upper and lower  respiratory specimens dur ing the acute phase of infection.  Positive  results are indicative of active infection with SARS-CoV-2.  Clinical  correlation with patient history and other diagnostic information is  necessary to determine patient infection status.  Positive results do  not rule out bacterial infection or co-infection with other viruses. If result is PRESUMPTIVE POSTIVE SARS-CoV-2 nucleic acids MAY BE PRESENT.   A presumptive positive result was obtained on the submitted specimen  and confirmed on repeat testing.  While 2019 novel coronavirus  (SARS-CoV-2) nucleic acids may be present in the submitted sample  additional confirmatory testing may be necessary for epidemiological  and / or clinical management purposes  to differentiate between  SARS-CoV-2 and other Sarbecovirus currently known to infect humans.  If clinically indicated additional testing with an alternate test  methodology [id number]) is advised. The SARS-CoV-2 RNA is generally  detectable in upper and lower respiratory sp ecimens during the acute  phase of infection. The expected result is Negative. Fact Sheet for Patients:  StrictlyIdeas.no Fact Sheet for Healthcare Providers: BankingDealers.co.za This test is not yet approved or cleared by the [place] FDA and has been authorized for detection and/or diagnosis of SARS-CoV-2 by FDA under an Emergency Use Authorization (EUA).  This EUA will remain in effect (meaning this test can be used) for the duration of the COVID-19 declaration under Section 564(b)(1) of the Act, 21 U.S.C. section 360bbb-3(b)(1), unless the authorization is terminated or revoked sooner. Performed at [facility], [address] [place], [place] [address]      Studies: Dg Pelvis 1-2 Views  Result Date: 10/29/2019 CLINICAL DATA:  Left hip replacement EXAM: PELVIS - 1-2 VIEW COMPARISON:  October 29, 2019 FINDINGS: The patient has undergone total hip arthroplasty on the left. The alignment appears near anatomic. There is no unexpected postsurgical finding. There are overlying skin staples and expected subcutaneous gas and soft tissue edema. The patient is status post remote percutaneous pinning of the right hip. 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 IMPRESSION: 1. Status post left total hip arthroplasty without evidence of unexpected postsurgical finding. 2. 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 Electronically Signed   By: Constance Holster M.D.   On: 10/29/2019 19:14   Ct Cervical Spine Wo Contrast  Result Date: 10/29/2019 CLINICAL DATA:  Status post fall last night transferring from a wheelchair to a chair. Initial encounter. EXAM: CT CERVICAL SPINE WITHOUT CONTRAST TECHNIQUE: Multidetector CT imaging of the cervical spine was performed without intravenous contrast. Multiplanar CT image reconstructions were also generated. COMPARISON:  None. FINDINGS: Alignment: Facet degenerative change results in trace anterolisthesis C4 on C5, 0.2 cm anterolisthesis C6 on C7 and C7 on T1. No traumatic listhesis. Skull base and vertebrae: No acute fracture. No primary bone lesion or focal pathologic process. Soft tissues and spinal canal: No prevertebral fluid or swelling.  No visible canal hematoma. Atherosclerotic vascular disease is noted. Disc levels: Loss of disc space height is worst at C3-4, C5-6 and C6-7. Advanced multilevel facet degenerative change is also present. Upper chest: Lung apices are clear. Other: None. IMPRESSION: No acute abnormality. Advanced multilevel degenerative change. Electronically Signed   By: Inge Rise M.D.   On: 10/29/2019 10:04   Dg Chest Portable 1 View  Result Date: 10/29/2019 CLINICAL DATA:  Left hip fracture. EXAM: PORTABLE CHEST 1 VIEW COMPARISON:  March 31, 2019. FINDINGS: The heart size and mediastinal contours  are within normal limits. Both lungs are clear. The visualized skeletal structures are unremarkable. IMPRESSION: No active disease. Electronically Signed   By: Marijo Conception M.D.   On: 10/29/2019 10:40   Dg Hip Operative Unilat W Or W/o Pelvis Left  Result Date: 10/29/2019 CLINICAL DATA:  Hip replacement EXAM: OPERATIVE LEFT HIP (WITH PELVIS IF PERFORMED) 3 VIEWS TECHNIQUE: Fluoroscopic spot image(s) were submitted for interpretation post-operatively. COMPARISON:  October 29, 2019 FINDINGS: The patient has undergone total hip arthroplasty on the left. The alignment is near anatomic. There is no evidence for hardware fracture or failure. There are expected postsurgical changes. IMPRESSION: Status post left total hip arthroplasty. No evidence for hardware complication. Electronically Signed   By: Constance Holster M.D.   On: 10/29/2019 17:54   Dg Hip Unilat W Or Wo Pelvis 2-3 Views Left  Result Date: 10/29/2019 CLINICAL DATA:  Left hip pain after fall last night. EXAM: DG HIP (WITH OR WITHOUT PELVIS) 2-3V LEFT COMPARISON:  None. FINDINGS: Moderately displaced fracture is seen involving the proximal left femoral neck. Severe degenerative changes seen involving the right hip with prior surgical internal fixation of the proximal right femur. IMPRESSION: Moderately displaced proximal left femoral neck fracture. Electronically Signed   By: Marijo Conception M.D.   On: 10/29/2019 10:39    Scheduled Meds: . acetaminophen  500 mg Oral Q6H  . atenolol  50 mg Oral Daily  . budesonide  9 mg Oral Daily  . enoxaparin (LOVENOX) injection  30 mg Subcutaneous Q24H  . ferrous sulfate  325 mg Oral BID WC  . furosemide  20 mg Intravenous Once  . gabapentin  300 mg Oral QHS  . latanoprost  1 drop Both Eyes QHS  . multivitamin-lutein  1 capsule Oral BID  . pantoprazole  40 mg Oral Daily  . thyroid  90 mg Oral Daily  . timolol  1 drop Both Eyes Daily  . traMADol  50 mg Oral Q6H   Continuous Infusions: .   ceFAZolin (ANCEF) IV 2 g (10/30/19 0544)    Assessment/Plan:  1. Acute hypoxic respiratory failure.  Patient placed on 100% nonrebreather because she is a mouth breather.  Hopefully can taper down the oxygen today.  We will get a chest x-ray to make sure she did not aspirate.  We will give 1 dose of Lasix and DC IV fluids just in case fluid overload since she is 85 years old.  Case discussed with son on the phone and patient made a DO NOT RESUSCITATE. 2. Closed left hip fracture requiring operative repair.  Pain control.  Physical therapy.  Patient is a current resident at [facility]. 3. Hypertension on atenolol 4. Hypothyroidism on thyroid Armour 5. Glaucoma unspecified on Timoptic and Xalatan  Code Status:     Code Status Orders  (From admission, onward)         Start     Ordered   10/30/19  0816  Do not attempt resuscitation (DNR)  Continuous    Question Answer Comment  In the event of cardiac or respiratory ARREST Do not call a "code blue"   In the event of cardiac or respiratory ARREST Do not perform Intubation, CPR, defibrillation or ACLS   In the event of cardiac or respiratory ARREST Use medication by any route, position, wound care, and other measures to relive pain and suffering. May use oxygen, suction and manual treatment of airway obstruction as needed for comfort.   Comments nurse may pronounce      10/30/19 0815        Code Status History    Date Active Date Inactive Code Status Order ID Comments User Context   10/29/2019 1201 10/30/2019 0815 Full Code [id number]  Para Skeans, MD ED   07/18/2019 1852 07/20/2019 1822 Full Code [id number]  Fritzi Mandes, MD Inpatient   03/31/2019 2255 04/05/2019 1704 Full Code [id number]  Lance Coon, MD ED   Advance Care Planning Activity    Advance Directive Documentation     Most Recent Value  Type of Advance Directive  Healthcare Power of Attorney, Living will  Pre-existing out of facility DNR order (yellow form or pink MOST form)   -  "MOST" Form in Place?  -     Family Communication: Spoke with son on the phone Disposition Plan: Respiratory status will need to be better prior to disposition  Consultants:  Orthopedic surgery  Procedures:  Left hip hemiarthroplasty  Time spent: 28 minutes  [place]

## 2019-10-30 NOTE — NC FL2 (Signed)
[place] LEVEL OF CARE SCREENING TOOL     IDENTIFICATION  Patient Name: Brittany Blackwell Birthdate: Dec 14, 1918 Sex: female Admission Date (Current Location): 10/29/2019  [place] and [place] Number:  Engineering geologist and Address:  [facility], [address] [place], [place] [address]      Provider Number: [id number]  Attending Physician Name and Address:  Loletha Grayer, MD  Relative Name and Phone Number:  Paulita Fujita    Current Level of Care: Hospital Recommended Level of Care: [place] Prior Approval Number:    Date Approved/Denied:   PASRR Number: [id number]  Discharge Plan: SNF    Current Diagnoses: Patient Active Problem List   Diagnosis Date Noted  . Acute on chronic respiratory failure with hypoxia ([place])   . Glaucoma   . Closed hip fracture requiring operative repair, left, sequela 10/29/2019  . Femoral neck fracture ([place]) 10/29/2019  . Dizziness 07/19/2019  . Near syncope 07/18/2019  . Closed right hip fracture ([place]) 03/31/2019  . Chicken pox 01/03/2016  . Essential hypertension 01/03/2016  . Degeneration macular 01/03/2016  . Hypothyroidism 01/03/2016    Orientation RESPIRATION BLADDER Height & Weight     Self, Time, Situation  O2(Anticipate oxygen will be discontinued) Indwelling catheter Weight: 54.4 kg Height:  5\' 5"  (165.1 cm)  BEHAVIORAL SYMPTOMS/MOOD NEUROLOGICAL BOWEL NUTRITION STATUS  Other (Comment)(None observed) (No seizures) Continent Diet  AMBULATORY STATUS COMMUNICATION OF NEEDS Skin   Limited Assist Verbally Surgical wounds, Bruising                       Personal Care Assistance Level of Assistance  Bathing, Dressing Bathing Assistance: Maximum assistance   Dressing Assistance: Limited assistance     Functional Limitations Info  Hearing, Speech   Hearing Info: Impaired Speech Info: Impaired    SPECIAL CARE FACTORS FREQUENCY  PT (By licensed PT), OT (By  licensed OT)     PT Frequency: 5-7 times week OT Frequency: 5-7 x week            Contractures Contractures Info: Not present    Additional Factors Info  Code Status Code Status Info: DNR             Current Medications (10/30/2019):  This is the current hospital active medication list Current Facility-Administered Medications  Medication Dose Route Frequency Provider Last Rate Last Dose  . acetaminophen (TYLENOL) tablet 500 mg  500 mg Oral Q6H Creig Hines, MD   500 mg at 10/30/19 0906  . acetaminophen (TYLENOL) tablet 650 mg  650 mg Oral Q6H PRN Para Skeans, MD      . atenolol (TENORMIN) tablet 50 mg  50 mg Oral Daily Para Skeans, MD   50 mg at 10/30/19 0906  . bisacodyl (DULCOLAX) suppository 10 mg  10 mg Rectal Daily PRN Creig Hines, MD      . budesonide (ENTOCORT EC) 24 hr capsule 9 mg  9 mg Oral Daily Para Skeans, MD   9 mg at 10/30/19 0905  . ceFAZolin (ANCEF) IVPB 2g/100 mL premix  2 g Intravenous Q6H Creig Hines, MD 200 mL/hr at 10/30/19 0913 2 g at 10/30/19 0913  . enoxaparin (LOVENOX) injection 30 mg  30 mg Subcutaneous Q24H Hall, Scott A, RPH   30 mg at 10/30/19 0905  . ferrous sulfate tablet 325 mg  325 mg Oral BID WC Creig Hines, MD   325 mg at 10/30/19 0906  .  gabapentin (NEURONTIN) capsule 300 mg  300 mg Oral QHS Creig Hines, MD      . HYDROcodone-acetaminophen (NORCO/VICODIN) 5-325 MG per tablet 1-2 tablet  1-2 tablet Oral Q4H PRN Creig Hines, MD      . latanoprost (XALATAN) 0.005 % ophthalmic solution 1 drop  1 drop Both Eyes QHS Patel, Ekta V, MD      . magnesium hydroxide (MILK OF MAGNESIA) suspension 30 mL  30 mL Oral Daily PRN Creig Hines, MD      . menthol-cetylpyridinium (CEPACOL) lozenge 3 mg  1 lozenge Oral PRN Creig Hines, MD       Or  . phenol (CHLORASEPTIC) mouth spray 1 spray  1 spray Mouth/Throat PRN Creig Hines, MD      . metoCLOPramide (REGLAN) tablet 5-10 mg  5-10 mg Oral Q8H PRN Creig Hines, MD       Or  . metoCLOPramide (REGLAN) injection 5-10 mg  5-10 mg Intravenous Q8H PRN Creig Hines, MD      . multivitamin-lutein (OCUVITE-LUTEIN) capsule 1 capsule  1 capsule Oral BID Para Skeans, MD   1 capsule at 10/30/19 0906  . ondansetron (ZOFRAN) tablet 4 mg  4 mg Oral Q6H PRN Creig Hines, MD       Or  . ondansetron (ZOFRAN) injection 4 mg  4 mg Intravenous Q6H PRN Creig Hines, MD      . pantoprazole (PROTONIX) EC tablet 40 mg  40 mg Oral Daily Para Skeans, MD   40 mg at 10/30/19 0906  . sodium phosphate (FLEET) 7-19 GM/118ML enema 1 enema  1 enema Rectal Once PRN Creig Hines, MD      . thyroid (ARMOUR) tablet 90 mg  90 mg Oral Daily Para Skeans, MD   90 mg at 10/30/19 0906  . timolol (TIMOPTIC) 0.5 % ophthalmic solution 1 drop  1 drop Both Eyes Daily Para Skeans, MD   1 drop at 10/30/19 0905  . traMADol (ULTRAM) tablet 50 mg  50 mg Oral Q6H Creig Hines, MD         Discharge Medications: Please see discharge summary for a list of discharge medications.  Relevant Imaging Results:  Relevant Lab Results:   Additional Information    Katrina Stack, RN

## 2019-10-30 NOTE — TOC Initial Note (Signed)
Transition of Care [facility]) - Initial/Assessment Note    Patient Details  Name: Brittany Blackwell MRN: [id number] Date of Birth: Apr 21, 1918  [facility] CM/SW Contact:    Katrina Stack, RN Phone Number: 10/30/2019, 11:56 AM  Clinical Narrative:                Patient presents from [facility]) where she is under a long term plan of care. She requires assist with her adls and says she ambulates with her walker at the facility. Per patient and her son, she will return to the facility when medically stable. FL2 completed and sent for signature.  [place] is current   Expected Discharge Plan: [place] Barriers to Discharge: No Barriers Identified   Patient Goals and CMS Choice   CMS Medicare.gov Compare Post Acute Care list provided to:: Legal Guardian Choice offered to / list presented to : Adult Children  Expected Discharge Plan and Services Expected Discharge Plan: [place] In-house Referral: NA Discharge Planning Services: CM Consult Post Acute Care Choice: Skilled Nursing Facility(Return) Living arrangements for the past 2 months: [place]: NA          Prior Living Arrangements/Services Living arrangements for the past 2 months: [place] Lives with:: Facility Resident Patient language and need for interpreter reviewed:: Yes Do you feel safe going back to the place where you live?: Yes      Need for Family Participation in Patient Care: No (Comment) Care giver support system in place?: Yes (comment)      Activities of Daily Living Home Assistive Devices/Equipment: Environmental consultant (specify type), Wheelchair ADL Screening (condition at time of admission) Patient's cognitive ability adequate to safely complete daily activities?: No Is the patient deaf or have difficulty hearing?: No Does the patient have difficulty seeing, even when wearing  glasses/contacts?: Yes Does the patient have difficulty concentrating, remembering, or making decisions?: Yes Patient able to express need for assistance with ADLs?: No Does the patient have difficulty dressing or bathing?: Yes Independently performs ADLs?: No Does the patient have difficulty walking or climbing stairs?: Yes Weakness of Legs: Both Weakness of Arms/Hands: Both  Permission Sought/Granted Permission sought to share information with : Case Manager, Customer service manager, Family Supports Permission granted to share information with : Yes, Verbal Permission Granted  Share Information with NAME: Joni Reining  Permission granted to share info w AGENCY: [place]  Permission granted to share info w Relationship: son Ed     Emotional Assessment Appearance:: Appears stated age Attitude/Demeanor/Rapport: Engaged Affect (typically observed): Calm Orientation: : Oriented to Self, Oriented to Place, Oriented to Situation Alcohol / Substance Use: Not Applicable Psych Involvement: No (comment)  Admission diagnosis:  Closed fracture of neck of left femur, initial encounter ([place]) [S72.002A] Closed left hip fracture, initial encounter ([place]) [S72.002A] Patient Active Problem List   Diagnosis Date Noted  . Acute on chronic respiratory failure with hypoxia ([place])   . Glaucoma   . Closed hip fracture requiring operative repair, left, sequela 10/29/2019  . Femoral neck fracture ([place]) 10/29/2019  . Dizziness 07/19/2019  . Near syncope 07/18/2019  . Closed right hip fracture ([place]) 03/31/2019  . Chicken pox 01/03/2016  . Essential hypertension 01/03/2016  . Degeneration macular 01/03/2016  . Hypothyroidism 01/03/2016   PCP:  [place] Pink,  MD Pharmacy:   [facility], [address] [address] Phone: [phone] Fax: [place], [address] [place]  [address] Phone: [phone] Fax: [phone]     Social Determinants of Health (SDOH) Interventions    Readmission Risk Interventions No flowsheet data found.

## 2019-10-30 NOTE — Progress Notes (Signed)
PT Cancellation Note  Patient Details Name: Brittany Blackwell MRN: [id number] DOB: 05/11/1918   Cancelled Treatment:    Reason Eval/Treat Not Completed: Patient at procedure or test/unavailable.  Order received and chart reviewed.  Nursing currently with pt. Will return when pt is appropriate.  Roxanne Gates, PT, DPT  Roxanne Gates 10/30/2019, 9:09 AM

## 2019-10-31 MED ORDER — FUROSEMIDE 10 MG/ML IJ SOLN
20.0000 mg | Freq: Once | INTRAMUSCULAR | Status: AC
  Administered 2019-10-31: 13:00:00 20 mg via INTRAVENOUS
  Filled 2019-10-31: qty 4

## 2019-10-31 NOTE — Progress Notes (Addendum)
Physical Therapy Treatment Patient Details Name: Brittany Blackwell MRN: [id number] DOB: [birth year] Today's Date: 10/31/2019    History of Present Illness Pt is a 85 year old female admitted s/p L hip hemiarthroplasty following a fall from her WC.  PMH includes macular degeneration, glaucoma, multiple falls and R hip ORIF in April 2020.    PT Comments    Pt very lethargic today, staring at ceiling and responding intermittently to PT during there ex and bed mobility attempt.  Pt able to perform LE there ex with R LE, requires assistance with L LE but stops midway through each movement without VC's.  Pt agreed to attempt to get to EOB but stated pain with all movement and pushed away from bedside.  Pt did relax and let PT adjust pillows and position pt for comfort as she tends to lean to the right side of the bed with her head against or near the bed rail.  PT noted pt's O2 sats to be in the mid 80's near beginning of session and increased to 2.5L, encouraging pt to breathe through her nose as she keeps her mouth open at all times.  O2 did increase to low 90's with movement and 90% at rest.  RN notified.  Pt will continue to benefit from skilled PT with focus on strength, tolerance to activity and pain management.  Follow Up Recommendations  SNF     Equipment Recommendations  None recommended by PT    Recommendations for Other Services       Precautions / Restrictions Precautions Precautions: Fall;Anterior Hip Restrictions Weight Bearing Restrictions: Yes LLE Weight Bearing: Weight bearing as tolerated    Mobility  Bed Mobility Overal bed mobility: (Attempted to move to EOB but reported pain with all movement and stopped midway through each motion.  PT assisted pt in return to bed and positioning.)                Transfers                    Ambulation/Gait                 Stairs             Wheelchair Mobility    Modified Rankin (Stroke Patients  Only)       Balance                                            Cognition Arousal/Alertness: Lethargic Behavior During Therapy: WFL for tasks assessed/performed;Flat affect Overall Cognitive Status: Within Functional Limits for tasks assessed                                 General Comments: Pt less responsive today, staring at ceiling and answering questions intermittently.  Did attempt to follow directions 90% of time but did not follow through with most activity.      Exercises Total Joint Exercises Ankle Circles/Pumps: AROM;Both;10 reps;Supine Quad Sets: Left;10 reps;Supine;Strengthening(Responded to PT's hand placed on posterior knee and finger placed on anterior patella with PT stating "press my hand down, now relax".) Short Arc Quad: AAROM;10 reps;Supine;Left(Partial range only when performed independently.) Hip ABduction/ADduction: AAROM;Both;10 reps;Supine;Strengthening(Able to perform with R LE but "trails off" without VC's.) Other Exercises Other Exercises: Time to monitor vitals and increase  O2 to 2.5L.  O2 increased from 85% to 90-94% range, fluctuating with movement and PT cuing pt to breathe through her nose and not her mouth.  x5 min    General Comments        Pertinent Vitals/Pain Pain Assessment: Faces Faces Pain Scale: Hurts even more Pain Location: L hip and bilateral lower legs to touch. Pain Descriptors / Indicators: Grimacing;Guarding Pain Intervention(s): Limited activity within patient's tolerance;Monitored during session    Home Living                      Prior Function            PT Goals (current goals can now be found in the care plan section) Acute Rehab PT Goals Patient Stated Goal: To return home and continue to walk with her walker. PT Goal Formulation: With patient Time For Goal Achievement: 11/13/19 Potential to Achieve Goals: Good Progress towards PT goals: Not progressing toward goals -  comment(Pt more lethargic today and less responsive to PT.)    Frequency    7X/week      PT Plan Current plan remains appropriate;Frequency needs to be updated    Co-evaluation PT/OT/SLP Co-Evaluation/Treatment: Yes            AM-PAC PT "6 Clicks" Mobility   Outcome Measure  Help needed turning from your back to your side while in a flat bed without using bedrails?: A Lot Help needed moving from lying on your back to sitting on the side of a flat bed without using bedrails?: A Lot Help needed moving to and from a bed to a chair (including a wheelchair)?: Total Help needed standing up from a chair using your arms (e.g., wheelchair or bedside chair)?: Total Help needed to walk in hospital room?: Total Help needed climbing 3-5 steps with a railing? : Total 6 Click Score: 8    End of Session Equipment Utilized During Treatment: Gait belt;Oxygen Activity Tolerance: Patient limited by pain;Patient limited by fatigue;Patient limited by lethargy Patient left: in bed;with call bell/phone within reach;with bed alarm set Nurse Communication: Mobility status PT Visit Diagnosis: Unsteadiness on feet (R26.81);Muscle weakness (generalized) (M62.81);Pain;History of falling (Z91.81) Pain - Right/Left: Left Pain - part of body: Hip     Time: [id number] PT Time Calculation (min) (ACUTE ONLY): 15 min  Charges:  $Therapeutic Exercise: 8-22 mins                     Roxanne Gates, PT, DPT   Roxanne Gates 10/31/2019, 11:01 AM, ADDENDED 10/31/2019, 1114

## 2019-10-31 NOTE — Progress Notes (Signed)
Patient ID: Brittany Blackwell, female   DOB: 10/17/1918, 85 y.o.   MRN: [id number] [facility] PROGRESS NOTE  Brittany Blackwell [id number] DOB: 04-08-1918 DOA: 10/29/2019 PCP: [place] Pink, MD  HPI/Subjective: Patient feeling better today than yesterday.  No complaints of shortness of breath or cough.  Does have a little pain in the hip.  She is on regular nasal cannula today.  She does not wear oxygen at her facility.  Patient did not eat much breakfast.  Objective: Vitals:   10/31/19 0842 10/31/19 1051  BP: (!) 144/87   Pulse: 84   Resp: 14   Temp: 98.4 F (36.9 C)   SpO2: 93% 90%    Intake/Output Summary (Last 24 hours) at 10/31/2019 1234 Last data filed at 10/31/2019 0918 Gross per 24 hour  Intake 120 ml  Output -  Net 120 ml   Filed Weights   10/29/19 0917  Weight: 54.4 kg    ROS: Review of Systems  Constitutional: Negative for chills and fever.  Eyes: Negative for blurred vision.  Respiratory: Negative for cough and shortness of breath.   Cardiovascular: Negative for chest pain.  Gastrointestinal: Negative for abdominal pain, constipation, diarrhea, nausea and vomiting.  Genitourinary: Negative for dysuria.  Musculoskeletal: Positive for joint pain.  Neurological: Negative for dizziness and headaches.   Exam: Physical Exam  HENT:  Nose: No mucosal edema.  Mouth/Throat: No oropharyngeal exudate or posterior oropharyngeal edema.  Eyes: Pupils are equal, round, and reactive to light. Conjunctivae and lids are normal.  Neck: No JVD present. Carotid bruit is not present. No edema present. No thyroid mass and no thyromegaly present.  Cardiovascular: S1 normal and S2 normal. Exam reveals no gallop.  No murmur heard. Pulses:      Dorsalis pedis pulses are 2+ on the right side and 2+ on the left side.  Respiratory: No respiratory distress. She has decreased breath sounds in the right lower field and the left lower field. She has no wheezes. She has no rhonchi. She  has no rales.  GI: Soft. Bowel sounds are normal. There is no abdominal tenderness.  Musculoskeletal:     Right ankle: She exhibits no swelling.     Left ankle: She exhibits no swelling.  Lymphadenopathy:    She has no cervical adenopathy.  Neurological: She is alert.  Answers more questions today than yesterday.  Able to elaborate more.  Skin: Skin is warm. Nails show no clubbing.  Chronic lower extremity discoloration  Psychiatric: She has a normal mood and affect.      Data Reviewed: Basic Metabolic Panel: Recent Labs  Lab 10/29/19 0931  NA 135  K 3.8  CL 103  CO2 23  GLUCOSE 123*  BUN 27*  CREATININE 0.92  CALCIUM 8.9   CBC: Recent Labs  Lab 10/29/19 0931 10/30/19 0730  WBC 12.1* 10.2  NEUTROABS 10.0*  --   HGB 10.9* 9.2*  HCT 33.2* 29.3*  MCV 86.0 89.9  PLT 174 161     Recent Results (from the past 240 hour(s))  SARS Coronavirus 2 by RT PCR (hospital order, performed in [facility] hospital lab) Nasopharyngeal Nasopharyngeal Swab     Status: None   Collection Time: 10/29/19 11:42 AM   Specimen: Nasopharyngeal Swab  Result Value Ref Range Status   SARS Coronavirus 2 NEGATIVE NEGATIVE Final    Comment: (NOTE) If result is NEGATIVE SARS-CoV-2 target nucleic acids are NOT DETECTED. The SARS-CoV-2 RNA is generally detectable in upper and lower  respiratory  specimens during the acute phase of infection. The lowest  concentration of SARS-CoV-2 viral copies this assay can detect is 250  copies / mL. A negative result does not preclude SARS-CoV-2 infection  and should not be used as the sole basis for treatment or other  patient management decisions.  A negative result may occur with  improper specimen collection / handling, submission of specimen other  than nasopharyngeal swab, presence of viral mutation(s) within the  areas targeted by this assay, and inadequate number of viral copies  (<250 copies / mL). A negative result must be combined with clinical   observations, patient history, and epidemiological information. If result is POSITIVE SARS-CoV-2 target nucleic acids are DETECTED. The SARS-CoV-2 RNA is generally detectable in upper and lower  respiratory specimens dur ing the acute phase of infection.  Positive  results are indicative of active infection with SARS-CoV-2.  Clinical  correlation with patient history and other diagnostic information is  necessary to determine patient infection status.  Positive results do  not rule out bacterial infection or co-infection with other viruses. If result is PRESUMPTIVE POSTIVE SARS-CoV-2 nucleic acids MAY BE PRESENT.   A presumptive positive result was obtained on the submitted specimen  and confirmed on repeat testing.  While 2019 novel coronavirus  (SARS-CoV-2) nucleic acids may be present in the submitted sample  additional confirmatory testing may be necessary for epidemiological  and / or clinical management purposes  to differentiate between  SARS-CoV-2 and other Sarbecovirus currently known to infect humans.  If clinically indicated additional testing with an alternate test  methodology [phone]) is advised. The SARS-CoV-2 RNA is generally  detectable in upper and lower respiratory sp ecimens during the acute  phase of infection. The expected result is Negative. Fact Sheet for Patients:  StrictlyIdeas.no Fact Sheet for Healthcare Providers: BankingDealers.co.za This test is not yet approved or cleared by the [place] FDA and has been authorized for detection and/or diagnosis of SARS-CoV-2 by FDA under an Emergency Use Authorization (EUA).  This EUA will remain in effect (meaning this test can be used) for the duration of the COVID-19 declaration under Section 564(b)(1) of the Act, 21 U.S.C. section 360bbb-3(b)(1), unless the authorization is terminated or revoked sooner. Performed at [facility], [address] [place], [place] [address]      Studies: Dg Pelvis 1-2 Views  Result Date: 10/29/2019 CLINICAL DATA:  Left hip replacement EXAM: PELVIS - 1-2 VIEW COMPARISON:  October 29, 2019 FINDINGS: The patient has undergone total hip arthroplasty on the left. The alignment appears near anatomic. There is no unexpected postsurgical finding. There are overlying skin staples and expected subcutaneous gas and soft tissue edema. The patient is status post remote percutaneous pinning of the right hip. 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 IMPRESSION: 1. Status post left total hip arthroplasty without evidence of unexpected postsurgical finding. 2. 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 Electronically Signed   By: Constance Holster M.D.   On: 10/29/2019 19:14   Dg Chest Port 1 View  Result Date: 10/30/2019 CLINICAL DATA:  Hypoxia. EXAM: PORTABLE CHEST 1 VIEW COMPARISON:  10/29/2019 FINDINGS: Stable heart size and aortic tortuosity. Stable appearance of probable hiatal hernia. Lung volumes are lower than on the chest x-ray yesterday. There may be a component of mild pulmonary interstitial edema. No airspace consolidation, pneumothorax or pleural fluid identified. IMPRESSION: 1. Lower lung volumes with possible mild pulmonary interstitial edema. 2. Stable probable hiatal hernia. 3. Stable  heart size and aortic tortuosity. Electronically Signed   By: Aletta Edouard M.D.   On: 10/30/2019 11:18   Dg Hip Operative Unilat W Or W/o Pelvis Left  Result Date: 10/29/2019 CLINICAL DATA:  Hip replacement EXAM: OPERATIVE LEFT HIP (WITH PELVIS IF PERFORMED) 3 VIEWS TECHNIQUE: Fluoroscopic spot image(s) were submitted for interpretation post-operatively. COMPARISON:  October 29, 2019 FINDINGS: The patient has undergone total hip arthroplasty on the left. The alignment is near anatomic. There is no evidence for hardware fracture or failure. There are expected postsurgical  changes. IMPRESSION: Status post left total hip arthroplasty. No evidence for hardware complication. Electronically Signed   By: Constance Holster M.D.   On: 10/29/2019 17:54    Scheduled Meds: . atenolol  50 mg Oral Daily  . budesonide  9 mg Oral Daily  . enoxaparin (LOVENOX) injection  30 mg Subcutaneous Q24H  . ferrous sulfate  325 mg Oral BID WC  . furosemide  20 mg Intravenous Once  . gabapentin  300 mg Oral QHS  . latanoprost  1 drop Both Eyes QHS  . multivitamin-lutein  1 capsule Oral BID  . pantoprazole  40 mg Oral Daily  . thyroid  90 mg Oral Daily  . timolol  1 drop Both Eyes Daily  . traMADol  50 mg Oral Q6H   Continuous Infusions:   Assessment/Plan:  1. Acute hypoxic respiratory failure.  Patient on 2.5 L of oxygen today.  I will give another dose of Lasix to see if we can diurese so we can get her off the oxygen. 2. Closed left hip fracture requiring operative repair.  Pain control.  Physical therapy.  Patient is a current resident at [facility]. 3. Hypertension on atenolol 4. Hypothyroidism on thyroid [place] 5. Glaucoma unspecified on Timoptic and Xalatan 6. Constipation try to get a bowel movement today 7. Postoperative anemia check CBC tomorrow morning.  Code Status:     Code Status Orders  (From admission, onward)         Start     Ordered   10/30/19 0816  Do not attempt resuscitation (DNR)  Continuous    Question Answer Comment  In the event of cardiac or respiratory ARREST Do not call a "code blue"   In the event of cardiac or respiratory ARREST Do not perform Intubation, CPR, defibrillation or ACLS   In the event of cardiac or respiratory ARREST Use medication by any route, position, wound care, and other measures to relive pain and suffering. May use oxygen, suction and manual treatment of airway obstruction as needed for comfort.   Comments nurse may pronounce      10/30/19 0815        Code Status History    Date Active Date Inactive Code  Status Order ID Comments User Context   10/29/2019 1201 10/30/2019 0815 Full Code [id number]  Para Skeans, MD ED   07/18/2019 1852 07/20/2019 1822 Full Code [id number]  Fritzi Mandes, MD Inpatient   03/31/2019 2255 04/05/2019 1704 Full Code [id number]  Lance Coon, MD ED   Advance Care Planning Activity    Advance Directive Documentation     Most Recent Value  Type of Advance Directive  Healthcare Power of Attorney, Living will  Pre-existing out of facility DNR order (yellow form or pink MOST form)  -  "MOST" Form in Place?  -     Family Communication: Spoke with son on the phone Disposition Plan: Evaluate daily on when to go back to her facility  Consultants:  Orthopedic surgery  Procedures:  Left hip hemiarthroplasty  Time spent: 28 minutes  [place]

## 2019-10-31 NOTE — Plan of Care (Signed)
  Problem: Education: Goal: Knowledge of General Education information will improve Description: Including pain rating scale, medication(s)/side effects and non-pharmacologic comfort measures Outcome: Not Progressing   Problem: Health Behavior/Discharge Planning: Goal: Ability to manage health-related needs will improve Outcome: Not Progressing   Problem: Clinical Measurements: Goal: Will remain free from infection Outcome: Progressing Goal: Respiratory complications will improve Outcome: Progressing Goal: Cardiovascular complication will be avoided Outcome: Progressing   Problem: Activity: Goal: Risk for activity intolerance will decrease Outcome: Progressing

## 2019-11-01 ENCOUNTER — Encounter: Payer: Self-pay | Admitting: Orthopedic Surgery

## 2019-11-01 DIAGNOSIS — D649 Anemia, unspecified: Secondary | ICD-10-CM | POA: Diagnosis not present

## 2019-11-01 DIAGNOSIS — E039 Hypothyroidism, unspecified: Secondary | ICD-10-CM | POA: Diagnosis not present

## 2019-11-01 DIAGNOSIS — J9601 Acute respiratory failure with hypoxia: Secondary | ICD-10-CM | POA: Diagnosis not present

## 2019-11-01 DIAGNOSIS — Z96642 Presence of left artificial hip joint: Secondary | ICD-10-CM | POA: Diagnosis not present

## 2019-11-01 DIAGNOSIS — R531 Weakness: Secondary | ICD-10-CM | POA: Diagnosis not present

## 2019-11-01 DIAGNOSIS — S72002D Fracture of unspecified part of neck of left femur, subsequent encounter for closed fracture with routine healing: Secondary | ICD-10-CM | POA: Diagnosis not present

## 2019-11-01 DIAGNOSIS — M25552 Pain in left hip: Secondary | ICD-10-CM | POA: Diagnosis not present

## 2019-11-01 DIAGNOSIS — K52831 Collagenous colitis: Secondary | ICD-10-CM | POA: Diagnosis not present

## 2019-11-01 DIAGNOSIS — M6281 Muscle weakness (generalized): Secondary | ICD-10-CM | POA: Diagnosis not present

## 2019-11-01 DIAGNOSIS — R41841 Cognitive communication deficit: Secondary | ICD-10-CM | POA: Diagnosis not present

## 2019-11-01 DIAGNOSIS — J9621 Acute and chronic respiratory failure with hypoxia: Secondary | ICD-10-CM | POA: Diagnosis not present

## 2019-11-01 DIAGNOSIS — U071 COVID-19: Secondary | ICD-10-CM | POA: Diagnosis not present

## 2019-11-01 DIAGNOSIS — R1311 Dysphagia, oral phase: Secondary | ICD-10-CM | POA: Diagnosis not present

## 2019-11-01 DIAGNOSIS — I10 Essential (primary) hypertension: Secondary | ICD-10-CM | POA: Diagnosis not present

## 2019-11-01 DIAGNOSIS — S72002S Fracture of unspecified part of neck of left femur, sequela: Secondary | ICD-10-CM | POA: Diagnosis not present

## 2019-11-01 DIAGNOSIS — Z741 Need for assistance with personal care: Secondary | ICD-10-CM | POA: Diagnosis not present

## 2019-11-01 DIAGNOSIS — H409 Unspecified glaucoma: Secondary | ICD-10-CM | POA: Diagnosis not present

## 2019-11-01 DIAGNOSIS — R0902 Hypoxemia: Secondary | ICD-10-CM | POA: Diagnosis not present

## 2019-11-01 DIAGNOSIS — S72001A Fracture of unspecified part of neck of right femur, initial encounter for closed fracture: Secondary | ICD-10-CM | POA: Diagnosis not present

## 2019-11-01 DIAGNOSIS — R279 Unspecified lack of coordination: Secondary | ICD-10-CM | POA: Diagnosis not present

## 2019-11-01 DIAGNOSIS — Z743 Need for continuous supervision: Secondary | ICD-10-CM | POA: Diagnosis not present

## 2019-11-01 DIAGNOSIS — Z4789 Encounter for other orthopedic aftercare: Secondary | ICD-10-CM | POA: Diagnosis not present

## 2019-11-01 DIAGNOSIS — R2681 Unsteadiness on feet: Secondary | ICD-10-CM | POA: Diagnosis not present

## 2019-11-01 DIAGNOSIS — Z9181 History of falling: Secondary | ICD-10-CM | POA: Diagnosis not present

## 2019-11-01 LAB — BASIC METABOLIC PANEL
Anion gap: 8 (ref 5–15)
BUN: 32 mg/dL — ABNORMAL HIGH (ref 8–23)
CO2: 25 mmol/L (ref 22–32)
Calcium: 8.7 mg/dL — ABNORMAL LOW (ref 8.9–10.3)
Chloride: 106 mmol/L (ref 98–111)
Creatinine, Ser: 0.96 mg/dL (ref 0.44–1.00)
GFR calc Af Amer: 57 mL/min — ABNORMAL LOW (ref >60)
GFR calc non Af Amer: 49 mL/min — ABNORMAL LOW (ref >60)
Glucose, Bld: 116 mg/dL — ABNORMAL HIGH (ref 70–99)
Potassium: 4.2 mmol/L (ref 3.5–5.1)
Sodium: 139 mmol/L (ref 135–145)

## 2019-11-01 LAB — CBC
HCT: 25.7 % — ABNORMAL LOW (ref 36.0–46.0)
Hemoglobin: 8.2 g/dL — ABNORMAL LOW (ref 12.0–15.0)
MCH: 29 pg (ref 26.0–34.0)
MCHC: 31.9 g/dL (ref 30.0–36.0)
MCV: 90.8 fL (ref 80.0–100.0)
Platelets: 164 10*3/uL (ref 150–400)
RBC: 2.83 MIL/uL — ABNORMAL LOW (ref 3.87–5.11)
RDW: 15.9 % — ABNORMAL HIGH (ref 11.5–15.5)
WBC: 11.5 10*3/uL — ABNORMAL HIGH (ref 4.0–10.5)
nRBC: 0 % (ref 0.0–0.2)

## 2019-11-01 LAB — GLUCOSE, CAPILLARY: Glucose-Capillary: 106 mg/dL — ABNORMAL HIGH (ref 70–99)

## 2019-11-01 MED ORDER — ATENOLOL 25 MG PO TABS: 12.5000 mg | ORAL_TABLET | Freq: Every day | ORAL | 0 refills | Status: AC

## 2019-11-01 MED ORDER — HYDROCODONE-ACETAMINOPHEN 5-325 MG PO TABS: 1.0000 | ORAL_TABLET | Freq: Four times a day (QID) | ORAL | 0 refills | Status: AC | PRN

## 2019-11-01 MED ORDER — ATENOLOL 25 MG PO TABS
12.5000 mg | ORAL_TABLET | Freq: Every day | ORAL | Status: DC
  Administered 2019-11-01: 12.5 mg via ORAL
  Filled 2019-11-01: qty 1

## 2019-11-01 MED ORDER — ENOXAPARIN SODIUM 30 MG/0.3ML ~~LOC~~ SOLN: 30.0000 mg | SUBCUTANEOUS | 0 refills | Status: AC

## 2019-11-01 MED ORDER — HYDROCODONE-ACETAMINOPHEN 5-325 MG PO TABS: 1.0000 | ORAL_TABLET | Freq: Four times a day (QID) | ORAL | 0 refills | Status: DC | PRN

## 2019-11-01 MED ORDER — FERROUS SULFATE 325 (65 FE) MG PO TABS: 325.0000 mg | ORAL_TABLET | Freq: Every day | ORAL | 0 refills | Status: AC

## 2019-11-01 NOTE — Discharge Summary (Signed)
[facility] NAME: Brittany Blackwell    MR#:  [id number]  DATE OF BIRTH:  09-18-1918  DATE OF ADMISSION:  10/29/2019 ADMITTING PHYSICIAN: Para Skeans, MD  DATE OF DISCHARGE: 11/01/2019  PRIMARY CARE PHYSICIAN: Maryland Pink, MD    ADMISSION DIAGNOSIS:  Closed fracture of neck of left femur, initial encounter ([place]) [S72.002A] Closed left hip fracture, initial encounter ([place]) [S72.002A]  DISCHARGE DIAGNOSIS:  Active Problems:   Essential hypertension   Degeneration macular   Hypothyroidism   Near syncope   Closed hip fracture requiring operative repair, left, sequela   Femoral neck fracture ([place])   Acute on chronic respiratory failure with hypoxia (HCC)   Glaucoma   SECONDARY DIAGNOSIS:   Past Medical History:  Diagnosis Date  . Acid reflux   . Glaucoma   . Hypertension   . Macular degeneration   . Thyroid disease     HOSPITAL COURSE:   1.  Acute hypoxic respiratory failure after procedure.  The patient was placed on 100% nonrebreather.  Was down to 2.5 L yesterday.  On 2 L currently but pulse ox is 98%.  Check a pulse ox on room air.  Hopefully can get off oxygen.  Can give as needed oxygen for pulse ox less than 88%. 2.  Closed left hip fracture requiring operative repair.  Pain control.  Try to get the Tylenol as quickly as possible.  Physical therapy.  Patient is a current resident at [facility].  Lovenox for 14 days.  Patient had an operation on 10/29/2019 with a left hip hemiarthroplasty by Dr. Donivan Scull.  Follow-up with orthopedics as outpatient. 3.  Postoperative anemia.  No signs of bleeding.  Start ferrous sulfate. 4.  Hypertension.  Because of heart rate being on the lower side we will decrease the dose of atenolol down to 12.5 mg daily. 5.  Hypothyroidism on Armour Thyroid 6. Glaucoma unspecified on Timoptic and Xalatan   DISCHARGE CONDITIONS:   Satisfactory  CONSULTS OBTAINED:  Orthopedic surgery  DRUG  ALLERGIES:   Allergies  Allergen Reactions  . Demerol [Meperidine] Other (See Comments)    BP drops    DISCHARGE MEDICATIONS:   Allergies as of 11/01/2019      Reactions   Demerol [meperidine] Other (See Comments)   BP drops      Medication List    STOP taking these medications   amLODipine 10 MG tablet Commonly known as: NORVASC     TAKE these medications   acetaminophen 325 MG tablet Commonly known as: TYLENOL Take 2 tablets (650 mg total) by mouth every 6 (six) hours as needed for mild pain (or Fever >/= 101).   Armour Thyroid 90 MG tablet Generic drug: thyroid Take 90 mg by mouth daily.   atenolol 25 MG tablet Commonly known as: TENORMIN Take 0.5 tablets (12.5 mg total) by mouth daily. What changed:  medication strength how much to take   budesonide 3 MG 24 hr capsule Commonly known as: ENTOCORT EC Take 9 mg by mouth daily.   enoxaparin 30 MG/0.3ML injection Commonly known as: LOVENOX Inject 0.3 mLs (30 mg total) into the skin daily for 14 doses.   ferrous sulfate 325 (65 FE) MG tablet Take 1 tablet (325 mg total) by mouth daily with breakfast.   HYDROcodone-acetaminophen 5-325 MG tablet Commonly known as: NORCO/VICODIN Take 1-2 tablets by mouth every 6 (six) hours as needed for severe pain.   latanoprost 0.005 % ophthalmic solution Commonly known  as: XALATAN Place 1 drop into both eyes at bedtime.   pantoprazole 40 MG tablet Commonly known as: PROTONIX Take 1 tablet (40 mg total) by mouth daily.   PRESERVISION AREDS 2+MULTI VIT PO Take 1 tablet by mouth 2 (two) times daily.   timolol 0.5 % ophthalmic solution Commonly known as: TIMOPTIC Place 1 drop into both eyes daily.        DISCHARGE INSTRUCTIONS:   Follow-up Dr. At rehab 1 day Follow-up clinical clinic orthopedics 1 week  If you experience worsening of your admission symptoms, develop shortness of breath, life threatening emergency, suicidal or homicidal thoughts you must seek  medical attention immediately by calling 911 or calling your MD immediately  if symptoms less severe.  You Must read complete instructions/literature along with all the possible adverse reactions/side effects for all the Medicines you take and that have been prescribed to you. Take any new Medicines after you have completely understood and accept all the possible adverse reactions/side effects.   Please note  You were cared for by a hospitalist during your hospital stay. If you have any questions about your discharge medications or the care you received while you were in the hospital after you are discharged, you can call the unit and asked to speak with the hospitalist on call if the hospitalist that took care of you is not available. Once you are discharged, your primary care physician will handle any further medical issues. Please note that NO REFILLS for any discharge medications will be authorized once you are discharged, as it is imperative that you return to your primary care physician (or establish a relationship with a primary care physician if you do not have one) for your aftercare needs so that they can reassess your need for medications and monitor your lab values.    Today   CHIEF COMPLAINT:   Chief Complaint  Patient presents with  . Hip Injury    HISTORY OF PRESENT ILLNESS:  Brittany Blackwell  is a 85 y.o. female found to have a hip fracture   VITAL SIGNS:  Blood pressure (!) 138/57, pulse 65, temperature 99.2 F (37.3 C), temperature source Oral, resp. rate 14, height 5\' 5"  (1.651 m), weight 54.4 kg, SpO2 98 %.  I/O:    Intake/Output Summary (Last 24 hours) at 11/01/2019 0845 Last data filed at 10/31/2019 1734 Gross per 24 hour  Intake 360 ml  Output 250 ml  Net 110 ml    PHYSICAL EXAMINATION:  GENERAL:  85 y.o.-year-old patient lying in the bed with no acute distress.  EYES: Pupils equal, round, reactive to light and accommodation. No scleral icterus. Extraocular  muscles intact.  HEENT: Head atraumatic, normocephalic. Oropharynx and nasopharynx clear.  NECK:  Supple, no jugular venous distention. No thyroid enlargement, no tenderness.  LUNGS: Normal breath sounds bilaterally, no wheezing, rales,rhonchi or crepitation. No use of accessory muscles of respiration.  CARDIOVASCULAR: S1, S2 normal. No murmurs, rubs, or gallops.  ABDOMEN: Soft, non-tender, non-distended. Bowel sounds present. No organomegaly or mass.  EXTREMITIES: No pedal edema, cyanosis, or clubbing.  NEUROLOGIC: Cranial nerves II through XII are intact. Muscle strength 5/5 in all extremities. Sensation intact. Gait not checked.  PSYCHIATRIC: The patient is alert and answers questions.  SKIN: No obvious rash, lesion, or ulcer.   DATA REVIEW:   CBC Recent Labs  Lab 11/01/19 0501  WBC 11.5*  HGB 8.2*  HCT 25.7*  PLT 164    Chemistries  Recent Labs  Lab 11/01/19 0501  NA 139  K 4.2  CL 106  CO2 25  GLUCOSE 116*  BUN 32*  CREATININE 0.96  CALCIUM 8.7*    Microbiology Results  Results for orders placed or performed during the hospital encounter of 10/29/19  SARS Coronavirus 2 by RT PCR (hospital order, performed in [facility] hospital lab) Nasopharyngeal Nasopharyngeal Swab     Status: None   Collection Time: 10/29/19 11:42 AM   Specimen: Nasopharyngeal Swab  Result Value Ref Range Status   SARS Coronavirus 2 NEGATIVE NEGATIVE Final    Comment: (NOTE) If result is NEGATIVE SARS-CoV-2 target nucleic acids are NOT DETECTED. The SARS-CoV-2 RNA is generally detectable in upper and lower  respiratory specimens during the acute phase of infection. The lowest  concentration of SARS-CoV-2 viral copies this assay can detect is 250  copies / mL. A negative result does not preclude SARS-CoV-2 infection  and should not be used as the sole basis for treatment or other  patient management decisions.  A negative result may occur with  improper specimen collection / handling,  submission of specimen other  than nasopharyngeal swab, presence of viral mutation(s) within the  areas targeted by this assay, and inadequate number of viral copies  (<250 copies / mL). A negative result must be combined with clinical  observations, patient history, and epidemiological information. If result is POSITIVE SARS-CoV-2 target nucleic acids are DETECTED. The SARS-CoV-2 RNA is generally detectable in upper and lower  respiratory specimens dur ing the acute phase of infection.  Positive  results are indicative of active infection with SARS-CoV-2.  Clinical  correlation with patient history and other diagnostic information is  necessary to determine patient infection status.  Positive results do  not rule out bacterial infection or co-infection with other viruses. If result is PRESUMPTIVE POSTIVE SARS-CoV-2 nucleic acids MAY BE PRESENT.   A presumptive positive result was obtained on the submitted specimen  and confirmed on repeat testing.  While 2019 novel coronavirus  (SARS-CoV-2) nucleic acids may be present in the submitted sample  additional confirmatory testing may be necessary for epidemiological  and / or clinical management purposes  to differentiate between  SARS-CoV-2 and other Sarbecovirus currently known to infect humans.  If clinically indicated additional testing with an alternate test  methodology [phone]) is advised. The SARS-CoV-2 RNA is generally  detectable in upper and lower respiratory sp ecimens during the acute  phase of infection. The expected result is Negative. Fact Sheet for Patients:  StrictlyIdeas.no Fact Sheet for Healthcare Providers: BankingDealers.co.za This test is not yet approved or cleared by the [place] FDA and has been authorized for detection and/or diagnosis of SARS-CoV-2 by FDA under an Emergency Use Authorization (EUA).  This EUA will remain in effect (meaning this test can be  used) for the duration of the COVID-19 declaration under Section 564(b)(1) of the Act, 21 U.S.C. section 360bbb-3(b)(1), unless the authorization is terminated or revoked sooner. Performed at [facility], [address], [place], [place] [address]     RADIOLOGY:  Dg Chest Port 1 View  Result Date: 10/30/2019 CLINICAL DATA:  Hypoxia. EXAM: PORTABLE CHEST 1 VIEW COMPARISON:  10/29/2019 FINDINGS: Stable heart size and aortic tortuosity. Stable appearance of probable hiatal hernia. Lung volumes are lower than on the chest x-ray yesterday. There may be a component of mild pulmonary interstitial edema. No airspace consolidation, pneumothorax or pleural fluid identified. IMPRESSION: 1. Lower lung volumes with possible mild pulmonary interstitial edema. 2. Stable probable hiatal hernia. 3.  Stable heart size and aortic tortuosity. Electronically Signed   By: Aletta Edouard M.D.   On: 10/30/2019 11:18     Management plans discussed with the patient, family and they are in agreement.  CODE STATUS:     Code Status Orders  (From admission, onward)         Start     Ordered   10/30/19 0816  Do not attempt resuscitation (DNR)  Continuous    Question Answer Comment  In the event of cardiac or respiratory ARREST Do not call a "code blue"   In the event of cardiac or respiratory ARREST Do not perform Intubation, CPR, defibrillation or ACLS   In the event of cardiac or respiratory ARREST Use medication by any route, position, wound care, and other measures to relive pain and suffering. May use oxygen, suction and manual treatment of airway obstruction as needed for comfort.   Comments nurse may pronounce      10/30/19 0815        Code Status History    Date Active Date Inactive Code Status Order ID Comments User Context   10/29/2019 1201 10/30/2019 0815 Full Code [id number]  Para Skeans, MD ED   07/18/2019 1852 07/20/2019 1822 Full Code [id number]  Fritzi Mandes, MD Inpatient    03/31/2019 2255 04/05/2019 1704 Full Code [id number]  Lance Coon, MD ED   Advance Care Planning Activity    Advance Directive Documentation     Most Recent Value  Type of Advance Directive  Healthcare Power of [place], Living will  Pre-existing out of facility DNR order (yellow form or pink MOST form)  -  "MOST" Form in Place?  -      TOTAL TIME TAKING CARE OF THIS PATIENT: 35 minutes.    Loletha Grayer M.D on 11/01/2019 at 8:45 AM  Between 7am to 6pm - Pager - [phone]  After 6pm go to [email or web address] - password [email or web address]  [facility]  CC: Primary care physician; [place] Pink, MD

## 2019-11-01 NOTE — Care Management Important Message (Signed)
Important Message  Patient Details  Name: Brittany Blackwell MRN: [id number] Date of Birth: 1918-06-20   Medicare Important Message Given:  Yes     [name] 11/01/2019, 10:45 AM

## 2019-11-01 NOTE — Progress Notes (Signed)
Completed medical necessity form for ambulance transportation.

## 2019-11-01 NOTE — Progress Notes (Signed)
  Subjective:  Patient reports pain as mild.  Doing well.  Objective:   VITALS:   Vitals:   10/31/19 1051 10/31/19 1357 10/31/19 1955 11/01/19 0456  BP:  (!) 115/56  (!) 138/57  Pulse:  67  65  Resp:  14    Temp:  98.1 F (36.7 C)  99.2 F (37.3 C)  TempSrc:  Oral  Oral  SpO2: 90% 93% 93% 98%  Weight:      Height:        PHYSICAL EXAM:  Neurologically intact ABD soft Neurovascular intact Sensation intact distally Intact pulses distally Dorsiflexion/Plantar flexion intact Incision: dressing C/D/I and scant drainage No cellulitis present Compartment soft  LABS  Results for orders placed or performed during the hospital encounter of 10/29/19 (from the past 24 hour(s))  CBC     Status: Abnormal   Collection Time: 11/01/19  5:01 AM  Result Value Ref Range   WBC 11.5 (H) 4.0 - 10.5 K/uL   RBC 2.83 (L) 3.87 - 5.11 MIL/uL   Hemoglobin 8.2 (L) 12.0 - 15.0 g/dL   HCT 25.7 (L) 36.0 - 46.0 %   MCV 90.8 80.0 - 100.0 fL   MCH 29.0 26.0 - 34.0 pg   MCHC 31.9 30.0 - 36.0 g/dL   RDW 15.9 (H) 11.5 - 15.5 %   Platelets 164 150 - 400 K/uL   nRBC 0.0 0.0 - 0.2 %  Basic metabolic panel     Status: Abnormal   Collection Time: 11/01/19  5:01 AM  Result Value Ref Range   Sodium 139 135 - 145 mmol/L   Potassium 4.2 3.5 - 5.1 mmol/L   Chloride 106 98 - 111 mmol/L   CO2 25 22 - 32 mmol/L   Glucose, Bld 116 (H) 70 - 99 mg/dL   BUN 32 (H) 8 - 23 mg/dL   Creatinine, Ser 0.96 0.44 - 1.00 mg/dL   Calcium 8.7 (L) 8.9 - 10.3 mg/dL   GFR calc non Af Amer 49 (L) >60 mL/min   GFR calc Af Amer 57 (L) >60 mL/min   Anion gap 8 5 - 15  Glucose, capillary     Status: Abnormal   Collection Time: 11/01/19  7:45 AM  Result Value Ref Range   Glucose-Capillary 106 (H) 70 - 99 mg/dL    No results found.  Assessment/Plan: 3 Days Post-Op   Active Problems:   Essential hypertension   Degeneration macular   Hypothyroidism   Near syncope   Closed hip fracture requiring operative repair,  left, sequela   Femoral neck fracture (HCC)   Acute on chronic respiratory failure with hypoxia (HCC)   Glaucoma   Anemia   Advance diet Up with therapy  Discharge per medicine Continue Lovenox X 14 days after surgery WBAT right lower extremity Follow up in Dr. Harlow Mares office Dec 1 call for appointment [phone]  Brittany Blackwell , PA-C 11/01/2019, 12:28 PM

## 2019-11-01 NOTE — Progress Notes (Signed)
Non emergent transport notified that patient needed transport services to go to [facility] today.  All patient belongins with patient. Patient displays no distress. Report called to facility.

## 2019-11-01 NOTE — Progress Notes (Signed)
Attempted to call report to [facility] and was asked to hold on x5. Then the receiving nurse comes to the phone and asked to call me back as she didn't know the patient was coming to them today and doesn't know what bed is being assigned to her.

## 2019-11-01 NOTE — Progress Notes (Addendum)
Pt is ready to be D/C to [facility] facility. Contacted Ricky at [facility] and made him aware that pt is ready to return to his facility today and he is agreeable. Contacted pt's son Paulita Fujita) and informed him that his mother is being D/C today to [facility].

## 2019-11-01 NOTE — Discharge Instructions (Signed)
Hip Fracture  A hip fracture is a break in the upper part of the thigh bone (femur). This is usually the result of an injury, commonly a fall. What are the causes? This condition may be caused by:  A direct hit or injury (trauma) to the side of the hip, such as from a fall or a car accident. What increases the risk? You are more likely to develop this condition if:  You have poor balance or an unsteady walking pattern (gait). Certain conditions contribute to poor balance, including Parkinson disease and dementia.  You have thinning or weakening of your bones, such as from osteopenia or osteoporosis.  You have cancer that spreads to the leg bones.  You have certain conditions that can weaken your bones, such as thyroid disorders, intestine disorders, or a lack (deficiency) of certain nutrients.  You smoke.  You take certain medicines, such as steroids.  You have a history of broken bones. What are the signs or symptoms? Symptoms of this condition include:  Pain over the injured hip. This is commonly felt on the side of the hip or in the front groin area.  Stiffness, bruising, and swelling over the hip.  Pain with movement of the leg, especially lifting it up. Pain often gets better with rest.  Difficulty or inability to stand, walk, or use the leg to support body weight (put weight on the leg).  The leg rolling outward when lying down.  The affected leg being shorter than the other leg. How is this diagnosed? This condition may be diagnosed based on:  Your symptoms.  A physical exam.  X-rays. These may be done: ? To confirm the diagnosis. ? To determine the type and location of the fracture. ? To check for other injuries.  MRI or CT scans. These may be done if the fracture is not visible on an X-ray. How is this treated? Treatment for this condition depends on the severity and location of your fracture. In most cases, surgery is necessary. Surgery may  involve:  Repairing the fracture with a screw, nail, or rod to hold the bone in place (open reduction and internal fixation, ORIF).  Replacing the damaged parts of the femur with metal implants (hemiarthroplasty or arthroplasty). If your fracture is less severe, or if you are not eligible for surgery, you may have non-surgical treatment. Non-surgical treatment may involve:  Using crutches, a walker, or a wheelchair until your health care provider says that you can support (bear) weight on your hip.  Medicines to help reduce pain and swelling.  Having regular X-rays to monitor your fracture and make sure that it is healing.  Physical therapy. You may need physical therapy after surgery, too. Follow these instructions at home: Activity  Do not use your injured leg to support your body weight until your health care provider says that you can. ? Follow standing and walking restrictions as told by your health care provider. ? Use crutches, a walker, or a wheelchair as directed.  Avoid any activities that cause pain or irritation in your hip. Ask your health care provider what activities are safe for you.  Do not drive or use heavy machinery until your health care provider approves.  If physical therapy was prescribed, do exercises as told by your health care provider. General instructions  Take over-the-counter and prescription medicines only as told by your health care provider.  If directed, put ice on the injured area: ? Put ice in a plastic bag. ?  Place a towel between your skin and the bag. °? Leave the ice on for 20 minutes, 2-3 times a day. °· Do not use any products that contain nicotine or tobacco, such as cigarettes and e-cigarettes. These can delay bone healing. If you need help quitting, ask your health care provider. °· Keep all follow-up visits as told by your health care provider. This is important. °How is this prevented? °· To prevent falls at home: °? Use a cane, walker,  or wheelchair as directed. °? Make sure your rooms and hallways are free of clutter, obstacles, and cords. °? Install grab bars in your bedroom and bathrooms. °? Always use handrails when going up and down stairs. °? Use nightlights around the house. °· Exercise regularly. Ask what forms of exercise are safe for you, such as walking and strength and balance exercises. °· Visit an eye doctor regularly to have your eyesight checked. This can help prevent falls. °· Make sure you get enough calcium and vitamin D. °· Do not use any products that contain nicotine or tobacco, such as cigarettes and e-cigarettes. If you need help quitting, ask your health care provider. °· Limit alcohol use. °· If you have an underlying condition that caused your hip fracture, work with your health care provider to manage your condition. °Contact a health care provider if: °· Your pain gets worse or it does not get better with rest or medicine. °· You develop any of the following in your leg or foot: °? Numbness. °? Tingling. °? A change in skin color (discoloration). °? Skin feeling cold to the touch. °Get help right away if: °· Your pain suddenly gets worse. °· You cannot move your hip. °Summary °· A hip fracture is a break in the upper part of the thigh bone (femur). °· Treatment typically require surgical management to restore stability and function to the hip. °· Pain medicine and icing of the affected leg can help manage pain and swelling. Follow directions as told by your health care provider. °This information is not intended to replace advice given to you by your health care provider. Make sure you discuss any questions you have with your health care provider. °Document Released: 12/02/2005 Document Revised: 08/22/2018 Document Reviewed: 01/04/2017 °Elsevier Patient Education © 2020 Elsevier Inc. ° °

## 2019-11-02 DIAGNOSIS — K52831 Collagenous colitis: Secondary | ICD-10-CM | POA: Diagnosis not present

## 2019-11-02 DIAGNOSIS — Z4789 Encounter for other orthopedic aftercare: Secondary | ICD-10-CM | POA: Diagnosis not present

## 2019-11-02 LAB — SURGICAL PATHOLOGY

## 2019-11-02 NOTE — TOC Progression Note (Signed)
Transition of Care [facility]) - Progression Note    Patient Details  Name: Brittany Blackwell MRN: [id number] Date of Birth: [birth year]  Transition of Care [facility]) CM/SW [place], RN Phone Number: 11/02/2019, 9:40 AM  Clinical Narrative:    Recived a call from [place] at [place] and he requested the auth number  The insurance auth had not been started Spoke to [employer] and started a retro auth  With [place] Reviewed the information with Dr Amalia Hailey  [facility] will call back with Auth information  Expected Discharge Plan: [place] Barriers to Discharge: No Barriers Identified  Expected Discharge Plan and Services Expected Discharge Plan: [place] In-house Referral: NA Discharge Planning Services: CM Consult Post Acute Care Choice: Skilled Nursing Facility(Return) Living arrangements for the past 2 months: [place] Expected Discharge Date: 11/01/19                         [facility] Arranged: NA           Social Determinants of Health (SDOH) Interventions    Readmission Risk Interventions No flowsheet data found.

## 2019-11-09 DIAGNOSIS — Z96642 Presence of left artificial hip joint: Secondary | ICD-10-CM | POA: Diagnosis not present

## 2019-11-09 DIAGNOSIS — S72001A Fracture of unspecified part of neck of right femur, initial encounter for closed fracture: Secondary | ICD-10-CM | POA: Diagnosis not present

## 2019-11-17 DIAGNOSIS — K52831 Collagenous colitis: Secondary | ICD-10-CM | POA: Diagnosis not present

## 2019-11-17 DIAGNOSIS — I10 Essential (primary) hypertension: Secondary | ICD-10-CM | POA: Diagnosis not present

## 2019-11-23 DIAGNOSIS — U071 COVID-19: Secondary | ICD-10-CM | POA: Diagnosis not present

## 2019-11-29 DIAGNOSIS — Z736 Limitation of activities due to disability: Secondary | ICD-10-CM | POA: Diagnosis not present

## 2019-11-29 DIAGNOSIS — R2681 Unsteadiness on feet: Secondary | ICD-10-CM | POA: Diagnosis not present

## 2019-11-29 DIAGNOSIS — Z9181 History of falling: Secondary | ICD-10-CM | POA: Diagnosis not present

## 2019-11-29 DIAGNOSIS — S72002D Fracture of unspecified part of neck of left femur, subsequent encounter for closed fracture with routine healing: Secondary | ICD-10-CM | POA: Diagnosis not present

## 2019-11-30 DIAGNOSIS — U071 COVID-19: Secondary | ICD-10-CM | POA: Diagnosis not present

## 2019-12-07 DIAGNOSIS — U071 COVID-19: Secondary | ICD-10-CM | POA: Diagnosis not present

## 2019-12-17 DIAGNOSIS — S72001D Fracture of unspecified part of neck of right femur, subsequent encounter for closed fracture with routine healing: Secondary | ICD-10-CM | POA: Diagnosis not present

## 2019-12-17 DIAGNOSIS — Z736 Limitation of activities due to disability: Secondary | ICD-10-CM | POA: Diagnosis not present

## 2019-12-17 DIAGNOSIS — S72002D Fracture of unspecified part of neck of left femur, subsequent encounter for closed fracture with routine healing: Secondary | ICD-10-CM | POA: Diagnosis not present

## 2019-12-17 DIAGNOSIS — M9702XA Periprosthetic fracture around internal prosthetic left hip joint, initial encounter: Secondary | ICD-10-CM | POA: Diagnosis not present

## 2019-12-17 DIAGNOSIS — Z9181 History of falling: Secondary | ICD-10-CM | POA: Diagnosis not present

## 2019-12-17 DIAGNOSIS — R2681 Unsteadiness on feet: Secondary | ICD-10-CM | POA: Diagnosis not present

## 2020-01-11 DIAGNOSIS — Z9181 History of falling: Secondary | ICD-10-CM | POA: Diagnosis not present

## 2020-01-17 DIAGNOSIS — S72002D Fracture of unspecified part of neck of left femur, subsequent encounter for closed fracture with routine healing: Secondary | ICD-10-CM | POA: Diagnosis not present

## 2020-01-17 DIAGNOSIS — Z736 Limitation of activities due to disability: Secondary | ICD-10-CM | POA: Diagnosis not present

## 2020-01-17 DIAGNOSIS — R2681 Unsteadiness on feet: Secondary | ICD-10-CM | POA: Diagnosis not present

## 2020-01-17 DIAGNOSIS — F321 Major depressive disorder, single episode, moderate: Secondary | ICD-10-CM | POA: Diagnosis not present

## 2020-01-17 DIAGNOSIS — Z9181 History of falling: Secondary | ICD-10-CM | POA: Diagnosis not present

## 2020-01-18 DIAGNOSIS — D649 Anemia, unspecified: Secondary | ICD-10-CM | POA: Diagnosis not present

## 2020-01-18 DIAGNOSIS — D509 Iron deficiency anemia, unspecified: Secondary | ICD-10-CM | POA: Diagnosis not present

## 2020-01-18 DIAGNOSIS — R319 Hematuria, unspecified: Secondary | ICD-10-CM | POA: Diagnosis not present

## 2020-01-18 DIAGNOSIS — E039 Hypothyroidism, unspecified: Secondary | ICD-10-CM | POA: Diagnosis not present

## 2020-01-18 DIAGNOSIS — Z79899 Other long term (current) drug therapy: Secondary | ICD-10-CM | POA: Diagnosis not present

## 2020-01-18 DIAGNOSIS — N390 Urinary tract infection, site not specified: Secondary | ICD-10-CM | POA: Diagnosis not present

## 2020-01-28 DIAGNOSIS — F321 Major depressive disorder, single episode, moderate: Secondary | ICD-10-CM | POA: Diagnosis not present

## 2020-02-01 DIAGNOSIS — R300 Dysuria: Secondary | ICD-10-CM | POA: Diagnosis not present

## 2020-02-01 DIAGNOSIS — N390 Urinary tract infection, site not specified: Secondary | ICD-10-CM | POA: Diagnosis not present

## 2020-02-14 DIAGNOSIS — F321 Major depressive disorder, single episode, moderate: Secondary | ICD-10-CM | POA: Diagnosis not present

## 2020-02-14 DIAGNOSIS — R2681 Unsteadiness on feet: Secondary | ICD-10-CM | POA: Diagnosis not present

## 2020-02-14 DIAGNOSIS — S72002D Fracture of unspecified part of neck of left femur, subsequent encounter for closed fracture with routine healing: Secondary | ICD-10-CM | POA: Diagnosis not present

## 2020-02-14 DIAGNOSIS — Z9181 History of falling: Secondary | ICD-10-CM | POA: Diagnosis not present

## 2020-02-14 DIAGNOSIS — Z736 Limitation of activities due to disability: Secondary | ICD-10-CM | POA: Diagnosis not present

## 2020-02-15 DIAGNOSIS — U071 COVID-19: Secondary | ICD-10-CM | POA: Diagnosis not present

## 2020-02-16 DIAGNOSIS — K219 Gastro-esophageal reflux disease without esophagitis: Secondary | ICD-10-CM | POA: Diagnosis not present

## 2020-02-16 DIAGNOSIS — E039 Hypothyroidism, unspecified: Secondary | ICD-10-CM | POA: Diagnosis not present

## 2020-02-16 DIAGNOSIS — K52831 Collagenous colitis: Secondary | ICD-10-CM | POA: Diagnosis not present

## 2020-02-16 DIAGNOSIS — I10 Essential (primary) hypertension: Secondary | ICD-10-CM | POA: Diagnosis not present

## 2020-02-16 DIAGNOSIS — H409 Unspecified glaucoma: Secondary | ICD-10-CM | POA: Diagnosis not present

## 2020-02-16 DIAGNOSIS — F419 Anxiety disorder, unspecified: Secondary | ICD-10-CM | POA: Diagnosis not present

## 2020-03-13 DIAGNOSIS — F321 Major depressive disorder, single episode, moderate: Secondary | ICD-10-CM | POA: Diagnosis not present

## 2020-03-16 DIAGNOSIS — Z736 Limitation of activities due to disability: Secondary | ICD-10-CM | POA: Diagnosis not present

## 2020-03-16 DIAGNOSIS — S72001D Fracture of unspecified part of neck of right femur, subsequent encounter for closed fracture with routine healing: Secondary | ICD-10-CM | POA: Diagnosis not present

## 2020-03-16 DIAGNOSIS — K52831 Collagenous colitis: Secondary | ICD-10-CM | POA: Diagnosis not present

## 2020-03-16 DIAGNOSIS — R2681 Unsteadiness on feet: Secondary | ICD-10-CM | POA: Diagnosis not present

## 2020-03-22 DIAGNOSIS — E039 Hypothyroidism, unspecified: Secondary | ICD-10-CM | POA: Diagnosis not present

## 2020-03-22 DIAGNOSIS — H409 Unspecified glaucoma: Secondary | ICD-10-CM | POA: Diagnosis not present

## 2020-03-22 DIAGNOSIS — R634 Abnormal weight loss: Secondary | ICD-10-CM | POA: Diagnosis not present

## 2020-03-22 DIAGNOSIS — I10 Essential (primary) hypertension: Secondary | ICD-10-CM | POA: Diagnosis not present

## 2020-03-22 DIAGNOSIS — K219 Gastro-esophageal reflux disease without esophagitis: Secondary | ICD-10-CM | POA: Diagnosis not present

## 2020-03-22 DIAGNOSIS — K52831 Collagenous colitis: Secondary | ICD-10-CM | POA: Diagnosis not present

## 2020-03-22 DIAGNOSIS — F419 Anxiety disorder, unspecified: Secondary | ICD-10-CM | POA: Diagnosis not present

## 2020-04-10 DIAGNOSIS — F321 Major depressive disorder, single episode, moderate: Secondary | ICD-10-CM | POA: Diagnosis not present

## 2020-04-19 DIAGNOSIS — K52831 Collagenous colitis: Secondary | ICD-10-CM | POA: Diagnosis not present

## 2020-04-19 DIAGNOSIS — E039 Hypothyroidism, unspecified: Secondary | ICD-10-CM | POA: Diagnosis not present

## 2020-04-19 DIAGNOSIS — K219 Gastro-esophageal reflux disease without esophagitis: Secondary | ICD-10-CM | POA: Diagnosis not present

## 2020-04-19 DIAGNOSIS — I10 Essential (primary) hypertension: Secondary | ICD-10-CM | POA: Diagnosis not present

## 2020-04-19 DIAGNOSIS — F419 Anxiety disorder, unspecified: Secondary | ICD-10-CM | POA: Diagnosis not present

## 2020-04-21 DIAGNOSIS — S72001D Fracture of unspecified part of neck of right femur, subsequent encounter for closed fracture with routine healing: Secondary | ICD-10-CM | POA: Diagnosis not present

## 2020-04-21 DIAGNOSIS — K52831 Collagenous colitis: Secondary | ICD-10-CM | POA: Diagnosis not present

## 2020-04-21 DIAGNOSIS — M79622 Pain in left upper arm: Secondary | ICD-10-CM | POA: Diagnosis not present

## 2020-04-21 DIAGNOSIS — M6281 Muscle weakness (generalized): Secondary | ICD-10-CM | POA: Diagnosis not present

## 2020-05-04 DIAGNOSIS — N390 Urinary tract infection, site not specified: Secondary | ICD-10-CM | POA: Diagnosis not present

## 2020-05-04 DIAGNOSIS — Z79899 Other long term (current) drug therapy: Secondary | ICD-10-CM | POA: Diagnosis not present

## 2020-05-04 DIAGNOSIS — R319 Hematuria, unspecified: Secondary | ICD-10-CM | POA: Diagnosis not present

## 2020-05-05 DIAGNOSIS — F321 Major depressive disorder, single episode, moderate: Secondary | ICD-10-CM | POA: Diagnosis not present

## 2020-05-16 DIAGNOSIS — M79622 Pain in left upper arm: Secondary | ICD-10-CM | POA: Diagnosis not present

## 2020-05-16 DIAGNOSIS — S72001D Fracture of unspecified part of neck of right femur, subsequent encounter for closed fracture with routine healing: Secondary | ICD-10-CM | POA: Diagnosis not present

## 2020-05-16 DIAGNOSIS — R41841 Cognitive communication deficit: Secondary | ICD-10-CM | POA: Diagnosis not present

## 2020-05-16 DIAGNOSIS — K52831 Collagenous colitis: Secondary | ICD-10-CM | POA: Diagnosis not present

## 2020-05-16 DIAGNOSIS — M6281 Muscle weakness (generalized): Secondary | ICD-10-CM | POA: Diagnosis not present

## 2020-05-17 DIAGNOSIS — K219 Gastro-esophageal reflux disease without esophagitis: Secondary | ICD-10-CM | POA: Diagnosis not present

## 2020-05-17 DIAGNOSIS — N390 Urinary tract infection, site not specified: Secondary | ICD-10-CM | POA: Diagnosis not present

## 2020-05-17 DIAGNOSIS — I10 Essential (primary) hypertension: Secondary | ICD-10-CM | POA: Diagnosis not present

## 2020-05-17 DIAGNOSIS — F419 Anxiety disorder, unspecified: Secondary | ICD-10-CM | POA: Diagnosis not present

## 2020-05-17 DIAGNOSIS — E039 Hypothyroidism, unspecified: Secondary | ICD-10-CM | POA: Diagnosis not present

## 2020-05-17 DIAGNOSIS — H409 Unspecified glaucoma: Secondary | ICD-10-CM | POA: Diagnosis not present

## 2020-06-15 DIAGNOSIS — M6281 Muscle weakness (generalized): Secondary | ICD-10-CM | POA: Diagnosis not present

## 2020-06-15 DIAGNOSIS — K52831 Collagenous colitis: Secondary | ICD-10-CM | POA: Diagnosis not present

## 2020-06-15 DIAGNOSIS — M79622 Pain in left upper arm: Secondary | ICD-10-CM | POA: Diagnosis not present

## 2020-06-15 DIAGNOSIS — S72001D Fracture of unspecified part of neck of right femur, subsequent encounter for closed fracture with routine healing: Secondary | ICD-10-CM | POA: Diagnosis not present

## 2020-06-22 DIAGNOSIS — F419 Anxiety disorder, unspecified: Secondary | ICD-10-CM | POA: Diagnosis not present

## 2020-06-22 DIAGNOSIS — E039 Hypothyroidism, unspecified: Secondary | ICD-10-CM | POA: Diagnosis not present

## 2020-06-22 DIAGNOSIS — K219 Gastro-esophageal reflux disease without esophagitis: Secondary | ICD-10-CM | POA: Diagnosis not present

## 2020-06-22 DIAGNOSIS — I10 Essential (primary) hypertension: Secondary | ICD-10-CM | POA: Diagnosis not present

## 2020-06-22 DIAGNOSIS — K52831 Collagenous colitis: Secondary | ICD-10-CM | POA: Diagnosis not present

## 2020-06-29 DIAGNOSIS — I83018 Varicose veins of right lower extremity with ulcer other part of lower leg: Secondary | ICD-10-CM | POA: Diagnosis not present

## 2020-06-29 DIAGNOSIS — I83028 Varicose veins of left lower extremity with ulcer other part of lower leg: Secondary | ICD-10-CM | POA: Diagnosis not present

## 2020-07-03 DIAGNOSIS — I739 Peripheral vascular disease, unspecified: Secondary | ICD-10-CM | POA: Diagnosis not present

## 2020-07-03 DIAGNOSIS — L97821 Non-pressure chronic ulcer of other part of left lower leg limited to breakdown of skin: Secondary | ICD-10-CM | POA: Diagnosis not present

## 2020-07-03 DIAGNOSIS — Q845 Enlarged and hypertrophic nails: Secondary | ICD-10-CM | POA: Diagnosis not present

## 2020-07-03 DIAGNOSIS — F321 Major depressive disorder, single episode, moderate: Secondary | ICD-10-CM | POA: Diagnosis not present

## 2020-07-03 DIAGNOSIS — B351 Tinea unguium: Secondary | ICD-10-CM | POA: Diagnosis not present

## 2020-07-06 DIAGNOSIS — I83018 Varicose veins of right lower extremity with ulcer other part of lower leg: Secondary | ICD-10-CM | POA: Diagnosis not present

## 2020-07-06 DIAGNOSIS — I83028 Varicose veins of left lower extremity with ulcer other part of lower leg: Secondary | ICD-10-CM | POA: Diagnosis not present

## 2020-07-13 DIAGNOSIS — I83028 Varicose veins of left lower extremity with ulcer other part of lower leg: Secondary | ICD-10-CM | POA: Diagnosis not present

## 2020-07-13 DIAGNOSIS — I83018 Varicose veins of right lower extremity with ulcer other part of lower leg: Secondary | ICD-10-CM | POA: Diagnosis not present

## 2020-07-17 DIAGNOSIS — S72001D Fracture of unspecified part of neck of right femur, subsequent encounter for closed fracture with routine healing: Secondary | ICD-10-CM | POA: Diagnosis not present

## 2020-07-17 DIAGNOSIS — R52 Pain, unspecified: Secondary | ICD-10-CM | POA: Diagnosis not present

## 2020-07-17 DIAGNOSIS — M6281 Muscle weakness (generalized): Secondary | ICD-10-CM | POA: Diagnosis not present

## 2020-07-17 DIAGNOSIS — K52831 Collagenous colitis: Secondary | ICD-10-CM | POA: Diagnosis not present

## 2020-07-17 DIAGNOSIS — M79622 Pain in left upper arm: Secondary | ICD-10-CM | POA: Diagnosis not present

## 2020-07-19 DIAGNOSIS — F419 Anxiety disorder, unspecified: Secondary | ICD-10-CM | POA: Diagnosis not present

## 2020-07-19 DIAGNOSIS — I10 Essential (primary) hypertension: Secondary | ICD-10-CM | POA: Diagnosis not present

## 2020-07-19 DIAGNOSIS — K219 Gastro-esophageal reflux disease without esophagitis: Secondary | ICD-10-CM | POA: Diagnosis not present

## 2020-07-19 DIAGNOSIS — K52831 Collagenous colitis: Secondary | ICD-10-CM | POA: Diagnosis not present

## 2020-07-19 DIAGNOSIS — E039 Hypothyroidism, unspecified: Secondary | ICD-10-CM | POA: Diagnosis not present

## 2020-07-20 DIAGNOSIS — D649 Anemia, unspecified: Secondary | ICD-10-CM | POA: Diagnosis not present

## 2020-07-20 DIAGNOSIS — I83028 Varicose veins of left lower extremity with ulcer other part of lower leg: Secondary | ICD-10-CM | POA: Diagnosis not present

## 2020-07-20 DIAGNOSIS — I83018 Varicose veins of right lower extremity with ulcer other part of lower leg: Secondary | ICD-10-CM | POA: Diagnosis not present

## 2020-07-20 DIAGNOSIS — D509 Iron deficiency anemia, unspecified: Secondary | ICD-10-CM | POA: Diagnosis not present

## 2020-07-27 DIAGNOSIS — I83028 Varicose veins of left lower extremity with ulcer other part of lower leg: Secondary | ICD-10-CM | POA: Diagnosis not present

## 2020-07-27 DIAGNOSIS — I83018 Varicose veins of right lower extremity with ulcer other part of lower leg: Secondary | ICD-10-CM | POA: Diagnosis not present

## 2020-07-31 DIAGNOSIS — F321 Major depressive disorder, single episode, moderate: Secondary | ICD-10-CM | POA: Diagnosis not present

## 2020-08-03 DIAGNOSIS — I83018 Varicose veins of right lower extremity with ulcer other part of lower leg: Secondary | ICD-10-CM | POA: Diagnosis not present

## 2020-08-03 DIAGNOSIS — I83028 Varicose veins of left lower extremity with ulcer other part of lower leg: Secondary | ICD-10-CM | POA: Diagnosis not present

## 2020-08-10 DIAGNOSIS — I83018 Varicose veins of right lower extremity with ulcer other part of lower leg: Secondary | ICD-10-CM | POA: Diagnosis not present

## 2020-08-10 DIAGNOSIS — I83028 Varicose veins of left lower extremity with ulcer other part of lower leg: Secondary | ICD-10-CM | POA: Diagnosis not present

## 2020-08-16 DIAGNOSIS — I10 Essential (primary) hypertension: Secondary | ICD-10-CM | POA: Diagnosis not present

## 2020-08-16 DIAGNOSIS — K219 Gastro-esophageal reflux disease without esophagitis: Secondary | ICD-10-CM | POA: Diagnosis not present

## 2020-08-16 DIAGNOSIS — H409 Unspecified glaucoma: Secondary | ICD-10-CM | POA: Diagnosis not present

## 2020-08-16 DIAGNOSIS — F419 Anxiety disorder, unspecified: Secondary | ICD-10-CM | POA: Diagnosis not present

## 2020-08-16 DIAGNOSIS — E039 Hypothyroidism, unspecified: Secondary | ICD-10-CM | POA: Diagnosis not present

## 2020-08-17 DIAGNOSIS — I83028 Varicose veins of left lower extremity with ulcer other part of lower leg: Secondary | ICD-10-CM | POA: Diagnosis not present

## 2020-08-17 DIAGNOSIS — I83018 Varicose veins of right lower extremity with ulcer other part of lower leg: Secondary | ICD-10-CM | POA: Diagnosis not present

## 2020-08-28 DIAGNOSIS — F321 Major depressive disorder, single episode, moderate: Secondary | ICD-10-CM | POA: Diagnosis not present

## 2020-09-15 DIAGNOSIS — I739 Peripheral vascular disease, unspecified: Secondary | ICD-10-CM | POA: Diagnosis not present

## 2020-09-15 DIAGNOSIS — B351 Tinea unguium: Secondary | ICD-10-CM | POA: Diagnosis not present

## 2020-09-15 DIAGNOSIS — R609 Edema, unspecified: Secondary | ICD-10-CM | POA: Diagnosis not present

## 2020-09-15 DIAGNOSIS — Q845 Enlarged and hypertrophic nails: Secondary | ICD-10-CM | POA: Diagnosis not present

## 2020-09-25 DIAGNOSIS — F321 Major depressive disorder, single episode, moderate: Secondary | ICD-10-CM | POA: Diagnosis not present

## 2020-10-17 DIAGNOSIS — S72001D Fracture of unspecified part of neck of right femur, subsequent encounter for closed fracture with routine healing: Secondary | ICD-10-CM | POA: Diagnosis not present

## 2020-10-17 DIAGNOSIS — K52831 Collagenous colitis: Secondary | ICD-10-CM | POA: Diagnosis not present

## 2020-10-17 DIAGNOSIS — M6281 Muscle weakness (generalized): Secondary | ICD-10-CM | POA: Diagnosis not present

## 2020-10-20 DIAGNOSIS — B351 Tinea unguium: Secondary | ICD-10-CM | POA: Diagnosis not present

## 2020-10-23 DIAGNOSIS — F321 Major depressive disorder, single episode, moderate: Secondary | ICD-10-CM | POA: Diagnosis not present

## 2020-11-03 DIAGNOSIS — E038 Other specified hypothyroidism: Secondary | ICD-10-CM | POA: Diagnosis not present

## 2020-11-03 DIAGNOSIS — H4089 Other specified glaucoma: Secondary | ICD-10-CM | POA: Diagnosis not present

## 2020-11-03 DIAGNOSIS — F3341 Major depressive disorder, recurrent, in partial remission: Secondary | ICD-10-CM | POA: Diagnosis not present

## 2020-11-03 DIAGNOSIS — K219 Gastro-esophageal reflux disease without esophagitis: Secondary | ICD-10-CM | POA: Diagnosis not present

## 2020-11-03 DIAGNOSIS — G8929 Other chronic pain: Secondary | ICD-10-CM | POA: Diagnosis not present

## 2020-11-03 DIAGNOSIS — D508 Other iron deficiency anemias: Secondary | ICD-10-CM | POA: Diagnosis not present

## 2020-11-03 DIAGNOSIS — I10 Essential (primary) hypertension: Secondary | ICD-10-CM | POA: Diagnosis not present

## 2020-11-10 ENCOUNTER — Emergency Department: Payer: PPO

## 2020-11-10 ENCOUNTER — Emergency Department
Admission: EM | Admit: 2020-11-10 | Discharge: 2020-11-10 | Disposition: A | Discharge status: Home / Self Care | Payer: PPO | Attending: Emergency Medicine | Admitting: Emergency Medicine

## 2020-11-10 DIAGNOSIS — E039 Hypothyroidism, unspecified: Secondary | ICD-10-CM | POA: Insufficient documentation

## 2020-11-10 DIAGNOSIS — Z79899 Other long term (current) drug therapy: Secondary | ICD-10-CM | POA: Insufficient documentation

## 2020-11-10 DIAGNOSIS — M79662 Pain in left lower leg: Secondary | ICD-10-CM | POA: Diagnosis not present

## 2020-11-10 DIAGNOSIS — M25512 Pain in left shoulder: Secondary | ICD-10-CM | POA: Insufficient documentation

## 2020-11-10 DIAGNOSIS — R279 Unspecified lack of coordination: Secondary | ICD-10-CM | POA: Diagnosis not present

## 2020-11-10 DIAGNOSIS — J9811 Atelectasis: Secondary | ICD-10-CM | POA: Diagnosis not present

## 2020-11-10 DIAGNOSIS — I10 Essential (primary) hypertension: Secondary | ICD-10-CM | POA: Insufficient documentation

## 2020-11-10 DIAGNOSIS — F0390 Unspecified dementia without behavioral disturbance: Secondary | ICD-10-CM | POA: Diagnosis not present

## 2020-11-10 DIAGNOSIS — M79661 Pain in right lower leg: Secondary | ICD-10-CM | POA: Insufficient documentation

## 2020-11-10 DIAGNOSIS — Z87891 Personal history of nicotine dependence: Secondary | ICD-10-CM | POA: Diagnosis not present

## 2020-11-10 DIAGNOSIS — R0902 Hypoxemia: Secondary | ICD-10-CM | POA: Diagnosis not present

## 2020-11-10 DIAGNOSIS — W19XXXA Unspecified fall, initial encounter: Secondary | ICD-10-CM | POA: Insufficient documentation

## 2020-11-10 DIAGNOSIS — Z743 Need for continuous supervision: Secondary | ICD-10-CM | POA: Diagnosis not present

## 2020-11-10 DIAGNOSIS — Z043 Encounter for examination and observation following other accident: Secondary | ICD-10-CM | POA: Diagnosis not present

## 2020-11-10 DIAGNOSIS — Z96642 Presence of left artificial hip joint: Secondary | ICD-10-CM | POA: Diagnosis not present

## 2020-11-10 DIAGNOSIS — R52 Pain, unspecified: Secondary | ICD-10-CM | POA: Diagnosis not present

## 2020-11-10 LAB — BASIC METABOLIC PANEL
Anion gap: 11 (ref 5–15)
BUN: 27 mg/dL — ABNORMAL HIGH (ref 8–23)
CO2: 25 mmol/L (ref 22–32)
Calcium: 9.4 mg/dL (ref 8.9–10.3)
Chloride: 103 mmol/L (ref 98–111)
Creatinine, Ser: 0.9 mg/dL (ref 0.44–1.00)
GFR, Estimated: 57 mL/min — ABNORMAL LOW (ref >60)
Glucose, Bld: 107 mg/dL — ABNORMAL HIGH (ref 70–99)
Potassium: 4.5 mmol/L (ref 3.5–5.1)
Sodium: 139 mmol/L (ref 135–145)

## 2020-11-10 LAB — CBC
HCT: 32.9 % — ABNORMAL LOW (ref 36.0–46.0)
Hemoglobin: 10.8 g/dL — ABNORMAL LOW (ref 12.0–15.0)
MCH: 31.3 pg (ref 26.0–34.0)
MCHC: 32.8 g/dL (ref 30.0–36.0)
MCV: 95.4 fL (ref 80.0–100.0)
Platelets: 178 10*3/uL (ref 150–400)
RBC: 3.45 MIL/uL — ABNORMAL LOW (ref 3.87–5.11)
RDW: 13.2 % (ref 11.5–15.5)
WBC: 6.9 10*3/uL (ref 4.0–10.5)
nRBC: 0 % (ref 0.0–0.2)

## 2020-11-10 MED ORDER — ACETAMINOPHEN 500 MG PO TABS
1000.0000 mg | ORAL_TABLET | Freq: Once | ORAL | Status: AC
  Administered 2020-11-10: 1000 mg via ORAL
  Filled 2020-11-10: qty 2

## 2020-11-10 NOTE — ED Notes (Addendum)
Unable to obtain EKG due to muscle tremors. MD made aware

## 2020-11-10 NOTE — Discharge Instructions (Signed)
Use Tylenol for pain and fevers.  Up to 1000 mg per dose, up to 4 times per day.  Do not take more than 4000 mg of Tylenol/acetaminophen within 24 hours.Brittany Blackwell has no signs of broken bones or significant injury from her fall.  Please continue her typical care and continue her medications.  Return to the ED with any further falls or acute illnesses.

## 2020-11-10 NOTE — ED Notes (Signed)
This RN and Joneen Caraway, Therapist, sports at bedside. Pt able to get vertical with walker but has some difficulty in doing so. Pt is 2 person assist. Pt able to ambulate with walker to and from bed. MD made aware

## 2020-11-10 NOTE — ED Triage Notes (Signed)
Pt to ED via [facility] from [facility]. Per EMS pt had an unwitnessed fall where CNA found pt on the floor on her left side. Pt doesn't remember falling. Unknown downtime or LOC. Pt c/o of pain in her sacrum 3/10. Pt with dressed wounds bilaterally to lower extremities sustained prior to fall. Pt denies blood thinner use. Pt A&Ox3.

## 2020-11-10 NOTE — ED Provider Notes (Signed)
[facility] Emergency Department Provider Note ____________________________________________   First MD Initiated Contact with Patient 11/10/20 [phone]     (approximate)  I have reviewed the triage vital signs and the nursing notes.  HISTORY  Chief Complaint Fall   HPI Brittany Blackwell is a 86 y.o. femalewho presents to the ED for evaluation of an unwitnessed fall.  Chart review indicates patient has not been to our facility for 1 year.  At that time she was admitted for management of a closed left femoral neck fracture after a fall. Otherwise history of glaucoma, hypothyroidism and hypertension. Patient resides at a local SNF, [facility], and patient was reportedly found in the floor this morning after an unwitnessed fall.  Patient reports that she fell yesterday.  She is pleasantly disoriented and is requesting food.  She denies pain anywhere or recent illnesses.  History limited due to her baseline dementia.   Past Medical History:  Diagnosis Date  . Acid reflux   . Glaucoma   . Hypertension   . Macular degeneration   . Thyroid disease     Patient Active Problem List   Diagnosis Date Noted  . Anemia   . Acute on chronic respiratory failure with hypoxia ([place])   . Glaucoma   . Closed hip fracture requiring operative repair, left, sequela 10/29/2019  . Femoral neck fracture ([place]) 10/29/2019  . Dizziness 07/19/2019  . Near syncope 07/18/2019  . Closed right hip fracture ([place]) 03/31/2019  . Chicken pox 01/03/2016  . Essential hypertension 01/03/2016  . Degeneration macular 01/03/2016  . Hypothyroidism 01/03/2016    Past Surgical History:  Procedure Laterality Date  . ABDOMINAL HYSTERECTOMY    . ANTERIOR APPROACH HEMI HIP ARTHROPLASTY Left 10/29/2019   Procedure: ANTERIOR APPROACH HEMI HIP ARTHROPLASTY;  Surgeon: Creig Hines, MD;  Location: [facility] ORS;  Service: Orthopedics;  Laterality: Left;  . HIP PINNING,CANNULATED Right 04/01/2019    Procedure: CANNULATED HIP PINNING;  Surgeon: Lovell Sheehan, MD;  Location: [facility] ORS;  Service: Orthopedics;  Laterality: Right;  . TONSILLECTOMY      Prior to Admission medications   Medication Sig Start Date End Date Taking? Authorizing Provider  acetaminophen (TYLENOL) 325 MG tablet Take 2 tablets (650 mg total) by mouth every 6 (six) hours as needed for mild pain (or Fever >/= 101). 04/03/19   Dustin Flock, MD  atenolol (TENORMIN) 25 MG tablet Take 0.5 tablets (12.5 mg total) by mouth daily. 11/01/19   Loletha Grayer, MD  budesonide (ENTOCORT EC) 3 MG 24 hr capsule Take 9 mg by mouth daily.  02/12/19   [provider]  enoxaparin (LOVENOX) 30 MG/0.3ML injection Inject 0.3 mLs (30 mg total) into the skin daily for 14 doses. 11/01/19 11/15/19  Loletha Grayer, MD  ferrous sulfate 325 (65 FE) MG tablet Take 1 tablet (325 mg total) by mouth daily with breakfast. 11/01/19   Loletha Grayer, MD  HYDROcodone-acetaminophen (NORCO/VICODIN) 5-325 MG tablet Take 1 tablet by mouth every 6 (six) hours as needed for severe pain. 11/01/19   Wieting, Richard, MD  latanoprost (XALATAN) 0.005 % ophthalmic solution Place 1 drop into both eyes at bedtime.  03/22/19   [provider]  Multiple Vitamins-Minerals (PRESERVISION AREDS 2+MULTI VIT PO) Take 1 tablet by mouth 2 (two) times daily.     [provider]  pantoprazole (PROTONIX) 40 MG tablet Take 1 tablet (40 mg total) by mouth daily. 07/21/19   Vaughan Basta, MD  thyroid [facility] THYROID) 90 MG  tablet Take 90 mg by mouth daily.    [provider]  timolol (TIMOPTIC) 0.5 % ophthalmic solution Place 1 drop into both eyes daily.  03/22/19   [provider]    Allergies Demerol [meperidine]  Family History  Problem Relation Age of Onset  . Breast cancer Mother   . Heart attack Father     Social History Social History   Tobacco Use  . Smoking status: Former Research scientist (life sciences)  . Smokeless tobacco: Never  Used  . Tobacco comment: quit many years ago.  Substance Use Topics  . Alcohol use: No  . Drug use: No    Review of Systems  Unable to be accurately assessed due to patient's baseline dementia ____________________________________________   PHYSICAL EXAM:  VITAL SIGNS: Vitals:   11/10/20 1002 11/10/20 1131  BP: (!) 146/78 (!) 141/92  Pulse: 62 (!) 53  Resp: 16 16  Temp: 97.9 F (36.6 C)   SpO2: 99% 97%     Constitutional: Alert and pleasantly disoriented without distress.  Follows commands in all 4 extremities. Eyes: Conjunctivae are normal. PERRL. EOMI. Head: Atraumatic. Nose: No congestion/rhinnorhea. Mouth/Throat: Mucous membranes are moist.  Oropharynx non-erythematous. Neck: No stridor. No cervical spine tenderness to palpation. Cardiovascular: Normal rate, regular rhythm. Grossly normal heart sounds.  Good peripheral circulation. Respiratory: Normal respiratory effort.  No retractions. Lungs CTAB. Gastrointestinal: Soft , nondistended, nontender to palpation. No CVA tenderness. Musculoskeletal: Lower extremities wrapped up to the lower shin, bandage dated 2 days ago. Patient able to flex hips and knees of bilateral lower extremities with minimal pain and full ROM bilaterally. Patient refuses to abduct her left arm due to left shoulder pain, but will readily range her left elbow.  Right arm without evidence of trauma or impaired ROM. Examination of the back is without signs of trauma or deformity.  No spinal step-offs.  No point bony tenderness. Neurologic:  Normal speech and language. No gross focal neurologic deficits are appreciated.  Skin:  Skin is warm, dry and intact. No rash noted. Psychiatric: Mood and affect are normal. Speech and behavior are normal.  ____________________________________________   LABS (all labs ordered are listed, but only abnormal results are displayed)  Labs Reviewed  CBC - Abnormal; Notable for the following components:      Result  Value   RBC 3.45 (*)    Hemoglobin 10.8 (*)    HCT 32.9 (*)    All other components within normal limits  BASIC METABOLIC PANEL - Abnormal; Notable for the following components:   Glucose, Bld 107 (*)    BUN 27 (*)    GFR, Estimated 57 (*)    All other components within normal limits   ____________________________________________  12 Lead EKG  Unable to be accurately obtained due to patient's baseline tremulousness ____________________________________________  RADIOLOGY  ED MD interpretation: Plain films of the chest, pelvis, left hip and left shoulder reviewed by me without evidence of acute fracture or bony pathology.  CT head reviewed by me without evidence of acute cranial pathology.  Official radiology report(s): DG Chest 1 View  Result Date: 11/10/2020 CLINICAL DATA:  Unwitnessed fall. EXAM: CHEST  1 VIEW COMPARISON:  10/30/2019.  10/29/2019. FINDINGS: Mediastinum and hilar structures normal. Mild left base atelectasis. Stable tiny nodular opacity left upper lung most likely granuloma. Tiny left pleural effusion versus scarring. No pneumothorax. Heart size normal. Thoracic spine scoliosis and degenerative change. No acute bony abnormality. IMPRESSION: Mild left base atelectasis. No acute infiltrate. Tiny left pleural effusion  versus pleural scarring. Electronically Signed   By: Ione   On: 11/10/2020 11:14   CT Head Wo Contrast  Result Date: 11/10/2020 CLINICAL DATA:  Unwitnessed fall EXAM: CT HEAD WITHOUT CONTRAST CT CERVICAL SPINE WITHOUT CONTRAST TECHNIQUE: Multidetector CT imaging of the head and cervical spine was performed following the standard protocol without intravenous contrast. Multiplanar CT image reconstructions of the cervical spine were also generated. COMPARISON:  10/29/2019, 07/29/2019 FINDINGS: CT HEAD FINDINGS Brain: No evidence of acute infarction, hemorrhage, hydrocephalus, extra-axial collection or mass lesion/mass effect. Moderate low-density  changes within the periventricular and subcortical white matter compatible with chronic microvascular ischemic change. Moderate diffuse cerebral volume loss. Vascular: Atherosclerotic calcifications involving the large vessels of the skull base. No unexpected hyperdense vessel. Skull: Normal. Negative for fracture or focal lesion. Sinuses/Orbits: No acute finding. Other: None. CT CERVICAL SPINE FINDINGS Alignment: Facet joints are aligned without dislocation. Dens and lateral masses are aligned. Unchanged degenerative facet mediated grade 1 anterolisthesis of C6 on C7 and C7 on T1. Skull base and vertebrae: No acute fracture. Mild superior endplate irregularity at C7 is unchanged from prior. No primary bone lesion or focal pathologic process. Soft tissues and spinal canal: No prevertebral fluid or swelling. No visible canal hematoma. Disc levels: Advanced facet predominant multilevel degenerative changes throughout the cervical spine. No significant interval progression from prior CT. Upper chest: Biapical pleuroparenchymal scarring. Other: Bilateral carotid atherosclerosis. IMPRESSION: 1. No acute intracranial findings. 2. Chronic microvascular ischemic changes and cerebral volume loss. 3. No evidence of acute fracture or traumatic subluxation of the cervical spine. 4. Advanced multilevel degenerative changes of the cervical spine. Electronically Signed   By: Davina Poke D.O.   On: 11/10/2020 11:43   CT Cervical Spine Wo Contrast  Result Date: 11/10/2020 CLINICAL DATA:  Unwitnessed fall EXAM: CT HEAD WITHOUT CONTRAST CT CERVICAL SPINE WITHOUT CONTRAST TECHNIQUE: Multidetector CT imaging of the head and cervical spine was performed following the standard protocol without intravenous contrast. Multiplanar CT image reconstructions of the cervical spine were also generated. COMPARISON:  10/29/2019, 07/29/2019 FINDINGS: CT HEAD FINDINGS Brain: No evidence of acute infarction, hemorrhage, hydrocephalus,  extra-axial collection or mass lesion/mass effect. Moderate low-density changes within the periventricular and subcortical white matter compatible with chronic microvascular ischemic change. Moderate diffuse cerebral volume loss. Vascular: Atherosclerotic calcifications involving the large vessels of the skull base. No unexpected hyperdense vessel. Skull: Normal. Negative for fracture or focal lesion. Sinuses/Orbits: No acute finding. Other: None. CT CERVICAL SPINE FINDINGS Alignment: Facet joints are aligned without dislocation. Dens and lateral masses are aligned. Unchanged degenerative facet mediated grade 1 anterolisthesis of C6 on C7 and C7 on T1. Skull base and vertebrae: No acute fracture. Mild superior endplate irregularity at C7 is unchanged from prior. No primary bone lesion or focal pathologic process. Soft tissues and spinal canal: No prevertebral fluid or swelling. No visible canal hematoma. Disc levels: Advanced facet predominant multilevel degenerative changes throughout the cervical spine. No significant interval progression from prior CT. Upper chest: Biapical pleuroparenchymal scarring. Other: Bilateral carotid atherosclerosis. IMPRESSION: 1. No acute intracranial findings. 2. Chronic microvascular ischemic changes and cerebral volume loss. 3. No evidence of acute fracture or traumatic subluxation of the cervical spine. 4. Advanced multilevel degenerative changes of the cervical spine. Electronically Signed   By: Davina Poke D.O.   On: 11/10/2020 11:43   DG Shoulder Left  Result Date: 11/10/2020 CLINICAL DATA:  Unwitnessed fall onto left side. EXAM: LEFT SHOULDER - 2+ VIEW COMPARISON:  None. FINDINGS:  Advanced degenerative changes in the left AC and glenohumeral joints. No acute bony abnormality. Specifically, no fracture, subluxation, or dislocation. IMPRESSION: Advanced degenerative changes.  No acute bony abnormality. Electronically Signed   By: Rolm Baptise M.D.   On: 11/10/2020 11:19    DG Hip Unilat W or Wo Pelvis 2-3 Views Left  Result Date: 11/10/2020 CLINICAL DATA:  Unwitnessed fall onto left side. EXAM: DG HIP (WITH OR WITHOUT PELVIS) 2-3V LEFT COMPARISON:  None. FINDINGS: Left hip replacement. No hardware complicating feature. Screw seen within the proximal right femur. Degenerative changes in the right hip with joint space narrowing and spurring. Diffuse osteopenia. No acute fracture, subluxation or dislocation. Vascular calcifications noted. IMPRESSION: No acute bony abnormality. Electronically Signed   By: Rolm Baptise M.D.   On: 11/10/2020 11:18    ____________________________________________   PROCEDURES and INTERVENTIONS  Procedure(s) performed (including Critical Care):  Procedures  Medications  acetaminophen (TYLENOL) tablet 1,000 mg (1,000 mg Oral Given 11/10/20 1122)    ____________________________________________   MDM / ED COURSE   86 year old female presents to the ED for evaluation after an unwitnessed fall without evidence of significant injury and amenable to return to facility.  Normal vitals on room air.  Exam with vague tenderness over her left shoulder and refusal to abduct due to pain, otherwise without evidence of traumatic pathology, deformity or distress.  Her pain resolves after Tylenol ministration and range of motion to her left shoulder improves thereafter.  CT imaging of head and neck without evidence of ICH, CVA or fracture.  Plain films of her shoulder, pelvis, left hip and chest without evidence of acute traumatic pathology.  She subsequently gets up and ambulates with a walker, at her baseline.  I see no evidence of acute derangements to warrant further work-up in the ED for advanced imaging, considering her return to functional baseline.  We will return the patient to her facility with return precautions.   Clinical Course as of Nov 10 1229  Fri Nov 10, 2020  1212 Patient ambulates with a walker with nurses.   [DS]      Clinical Course User Index [DS] Vladimir Crofts, MD    ____________________________________________   FINAL CLINICAL IMPRESSION(S) / ED DIAGNOSES  Final diagnoses:  Fall, initial encounter     ED Discharge Orders    None       Marshall Kampf Tamala Julian   Note:  This document was prepared using Dragon voice recognition software and may include unintentional dictation errors.   Vladimir Crofts, MD 11/10/20 [phone]

## 2020-11-15 DIAGNOSIS — S72001D Fracture of unspecified part of neck of right femur, subsequent encounter for closed fracture with routine healing: Secondary | ICD-10-CM | POA: Diagnosis not present

## 2020-11-15 DIAGNOSIS — M6281 Muscle weakness (generalized): Secondary | ICD-10-CM | POA: Diagnosis not present

## 2020-11-15 DIAGNOSIS — K52831 Collagenous colitis: Secondary | ICD-10-CM | POA: Diagnosis not present

## 2020-11-17 IMAGING — CR PELVIS - 1-2 VIEW
2 series · 2 of 2 positions shown · non-contrast
Comparison: None.

CLINICAL DATA: Recent fall with hip pain, initial encounter

EXAM:
PELVIS - 1-2 VIEW

[pelvis ap (1 of 2)]
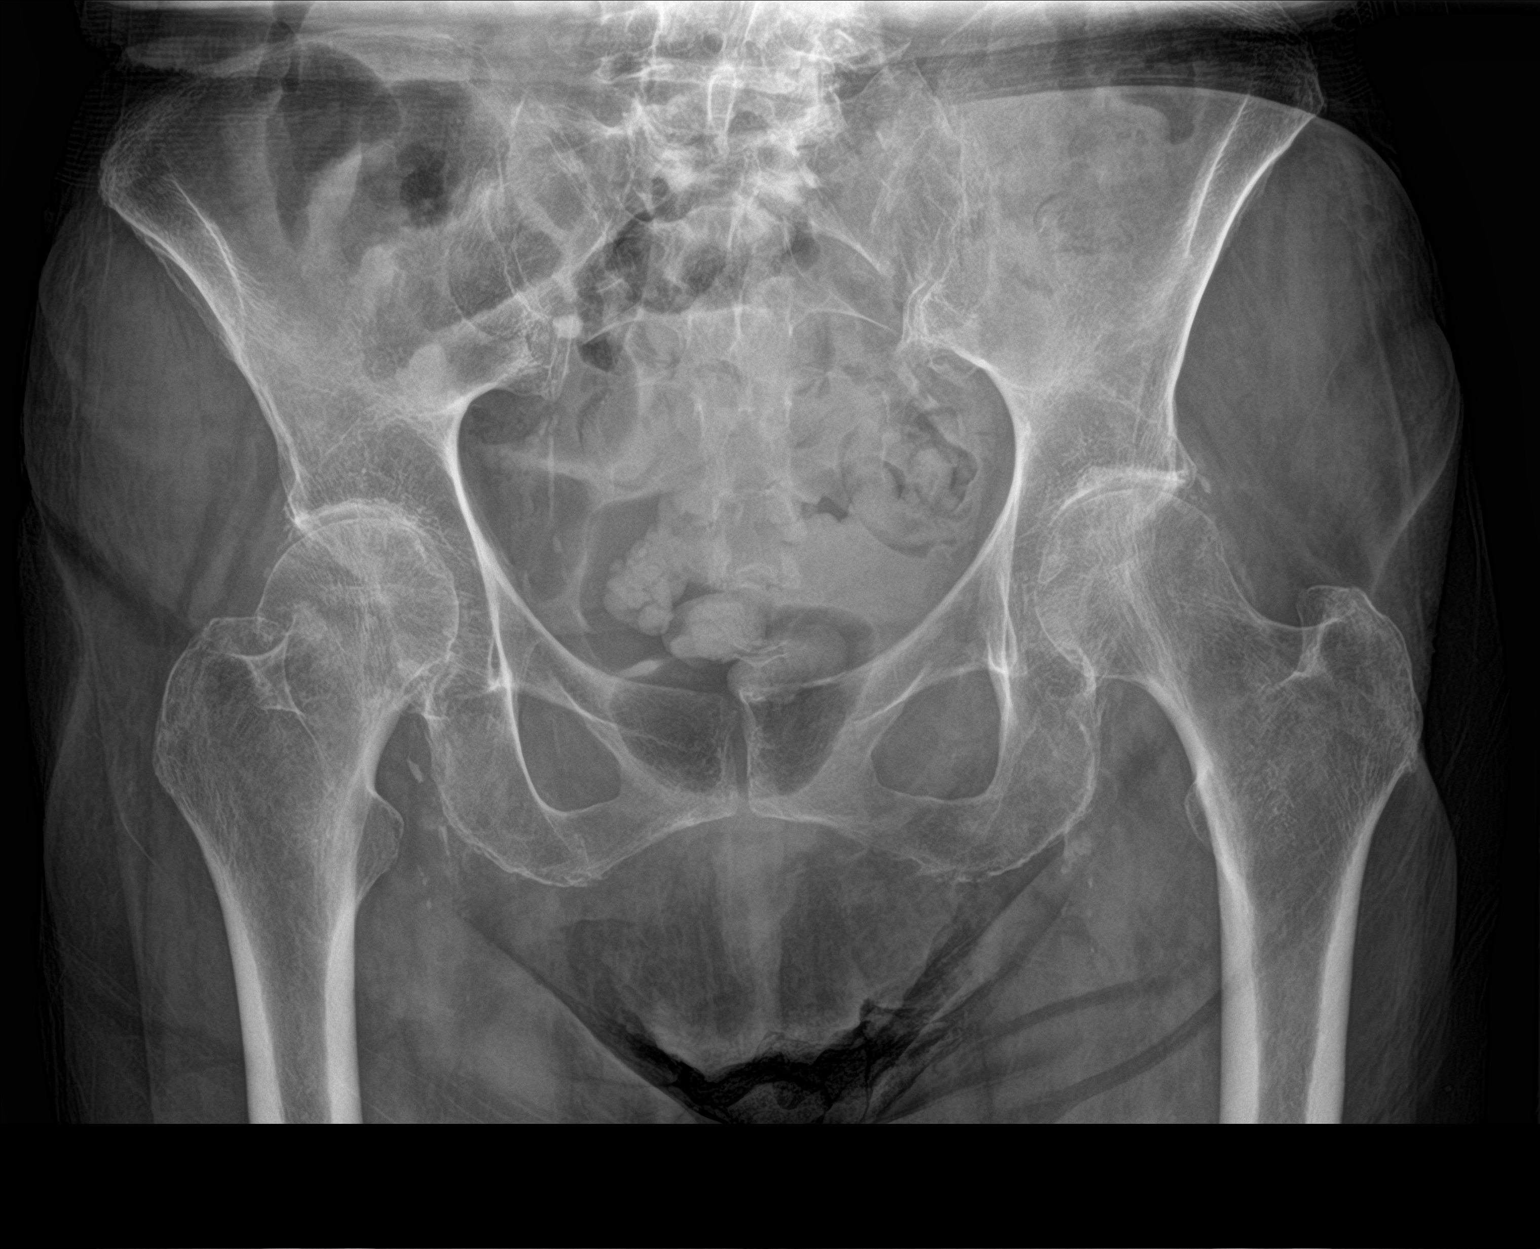

[pelvis ap (2 of 2)]
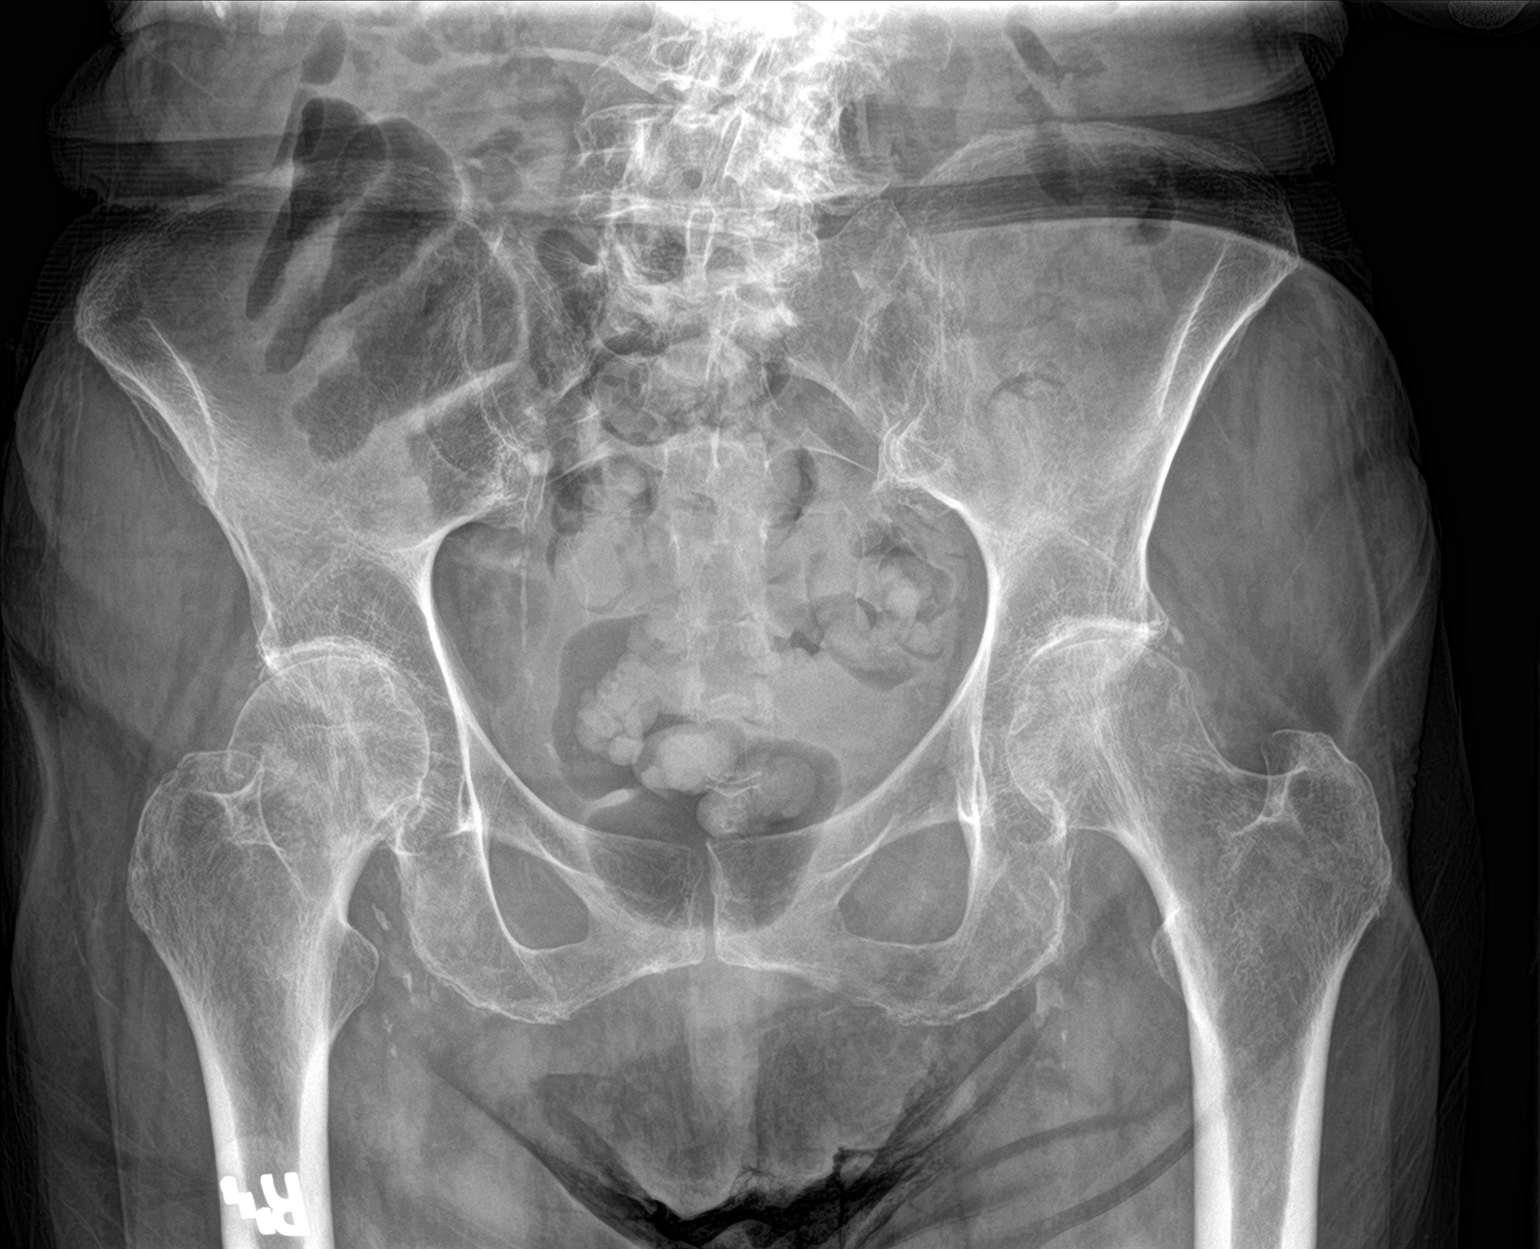

[2 of 2 positions shown; findings below may reference images not displayed]

FINDINGS: Pelvic ring is intact. Subcapital femoral neck fracture is noted
with impaction and angulation at the fracture site. No other focal
abnormality is noted.
IMPRESSION: Subcapital femoral neck fracture with impaction.

## 2020-11-18 IMAGING — XA OPERATIVE RIGHT HIP WITH PELVIS
2 series · 2 of 2 positions shown · non-contrast
Comparison: 03/31/2019

CLINICAL DATA: Right hip pinning

EXAM:
OPERATIVE right HIP (WITH PELVIS IF PERFORMED) 2 VIEWS
TECHNIQUE: Fluoroscopic spot image(s) were submitted for interpretation
post-operatively.

[Series 8: cont. · 1 of 1 slices shown (1 of 2)]
[im 1/1]
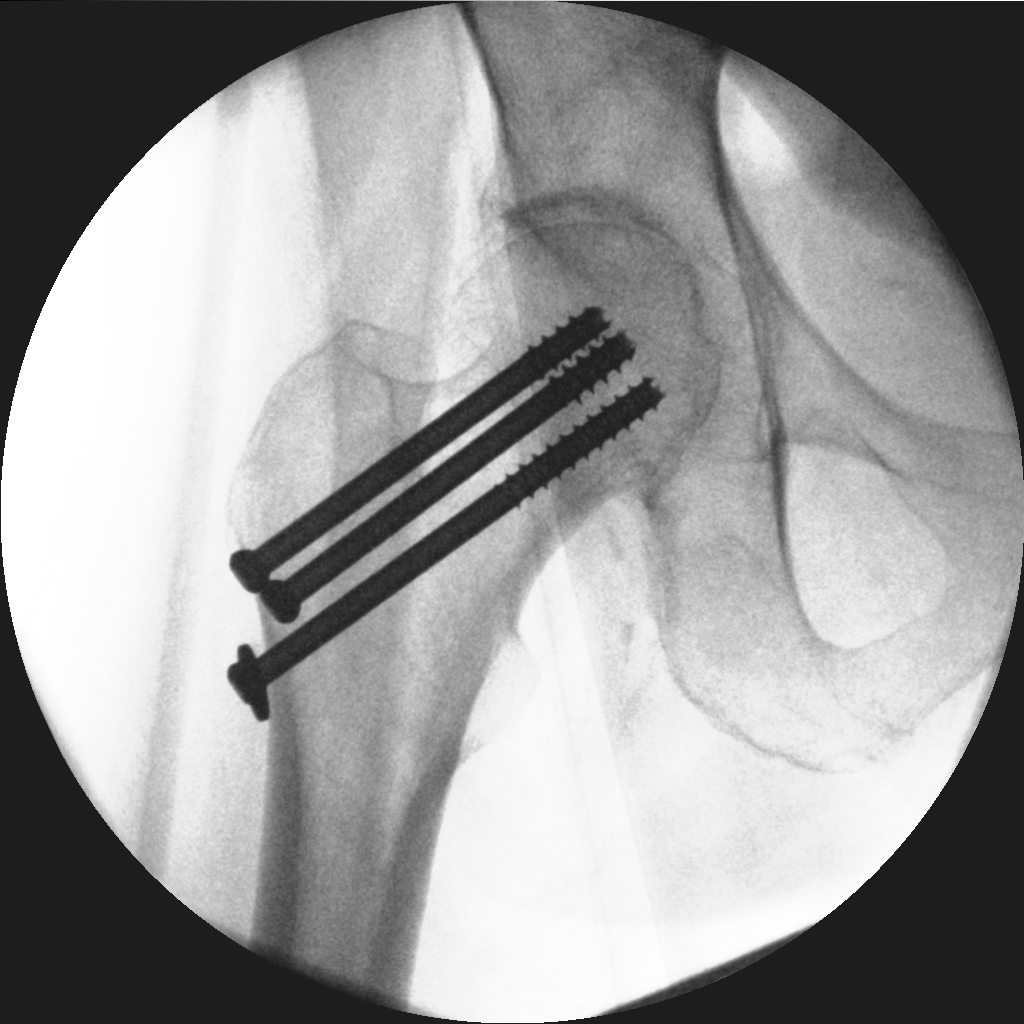

[Series 9: cont. · 1 of 1 slices shown (2 of 2)]
[im 1/1]
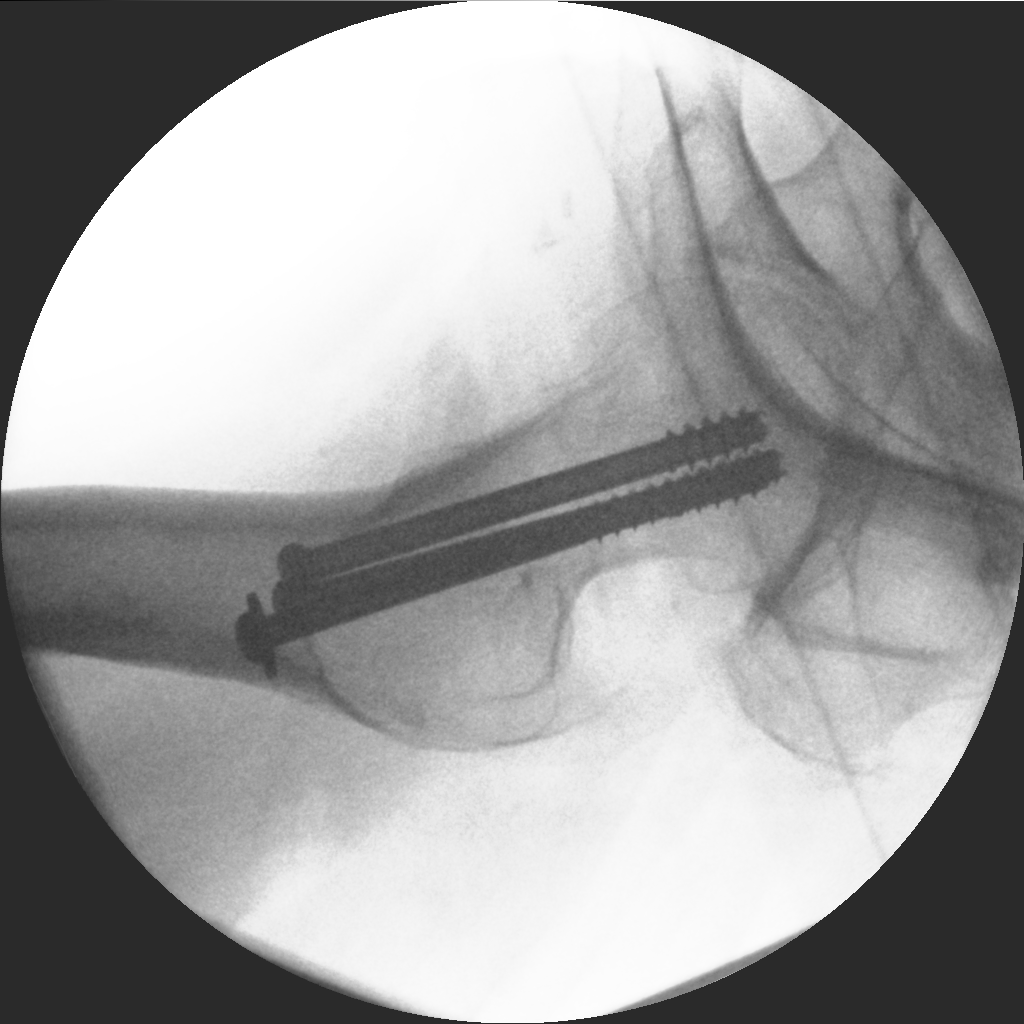

[2 of 2 positions shown; findings below may reference images not displayed]

FINDINGS: Two images show placement of 3 Jossy Bossety Taxes for treatment of a
femoral neck fracture. No radiographically detectable complication.
IMPRESSION: Placement of 3 Jossy Bossety Taxes for treatment of a right femoral neck
fracture.

## 2020-11-20 DIAGNOSIS — F321 Major depressive disorder, single episode, moderate: Secondary | ICD-10-CM | POA: Diagnosis not present

## 2020-11-28 DIAGNOSIS — E038 Other specified hypothyroidism: Secondary | ICD-10-CM | POA: Diagnosis not present

## 2020-11-28 DIAGNOSIS — F3341 Major depressive disorder, recurrent, in partial remission: Secondary | ICD-10-CM | POA: Diagnosis not present

## 2020-11-28 DIAGNOSIS — D508 Other iron deficiency anemias: Secondary | ICD-10-CM | POA: Diagnosis not present

## 2020-11-28 DIAGNOSIS — I10 Essential (primary) hypertension: Secondary | ICD-10-CM | POA: Diagnosis not present

## 2020-11-28 DIAGNOSIS — G8929 Other chronic pain: Secondary | ICD-10-CM | POA: Diagnosis not present

## 2020-11-28 DIAGNOSIS — H4089 Other specified glaucoma: Secondary | ICD-10-CM | POA: Diagnosis not present

## 2020-11-28 DIAGNOSIS — K219 Gastro-esophageal reflux disease without esophagitis: Secondary | ICD-10-CM | POA: Diagnosis not present

## 2020-11-28 DIAGNOSIS — F418 Other specified anxiety disorders: Secondary | ICD-10-CM | POA: Diagnosis not present

## 2020-12-22 DIAGNOSIS — F321 Major depressive disorder, single episode, moderate: Secondary | ICD-10-CM | POA: Diagnosis not present

## 2021-01-15 DIAGNOSIS — F321 Major depressive disorder, single episode, moderate: Secondary | ICD-10-CM | POA: Diagnosis not present

## 2021-01-18 DIAGNOSIS — D509 Iron deficiency anemia, unspecified: Secondary | ICD-10-CM | POA: Diagnosis not present

## 2021-01-18 DIAGNOSIS — E039 Hypothyroidism, unspecified: Secondary | ICD-10-CM | POA: Diagnosis not present

## 2021-01-18 DIAGNOSIS — D649 Anemia, unspecified: Secondary | ICD-10-CM | POA: Diagnosis not present

## 2021-01-22 DIAGNOSIS — J811 Chronic pulmonary edema: Secondary | ICD-10-CM | POA: Diagnosis not present

## 2021-01-22 DIAGNOSIS — R0602 Shortness of breath: Secondary | ICD-10-CM | POA: Diagnosis not present

## 2021-01-23 DIAGNOSIS — G8929 Other chronic pain: Secondary | ICD-10-CM | POA: Diagnosis not present

## 2021-01-23 DIAGNOSIS — I10 Essential (primary) hypertension: Secondary | ICD-10-CM | POA: Diagnosis not present

## 2021-01-23 DIAGNOSIS — R0602 Shortness of breath: Secondary | ICD-10-CM | POA: Diagnosis not present

## 2021-01-23 DIAGNOSIS — F3341 Major depressive disorder, recurrent, in partial remission: Secondary | ICD-10-CM | POA: Diagnosis not present

## 2021-02-13 DEATH — deceased

## 2021-06-18 IMAGING — DX DG CHEST 1V PORT
1 series · 1 of 1 positions shown · non-contrast
Comparison: 10/29/2019

CLINICAL DATA: Hypoxia.

EXAM:
PORTABLE CHEST 1 VIEW

[chest ap]
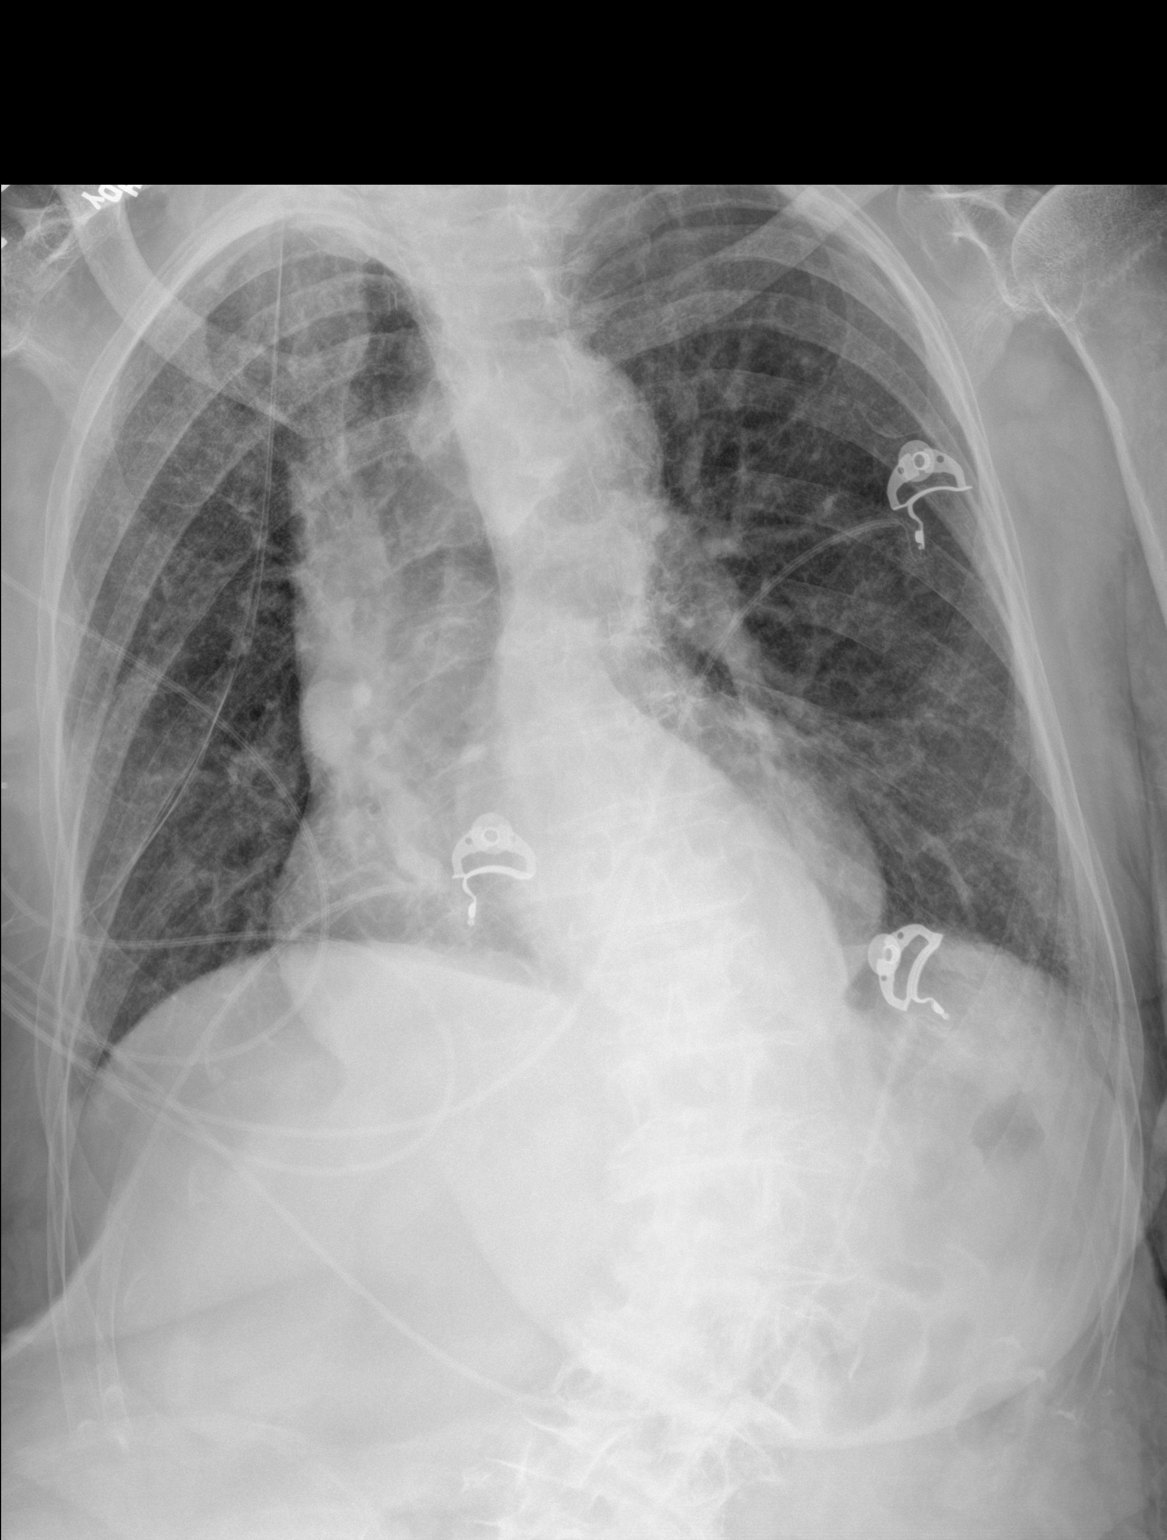

[1 of 1 positions shown; findings below may reference images not displayed]

FINDINGS: Stable heart size and aortic tortuosity. Stable appearance of
probable hiatal hernia. Lung volumes are lower than on the chest
x-ray yesterday. There may be a component of mild pulmonary
interstitial edema. No airspace consolidation, pneumothorax or
pleural fluid identified.
IMPRESSION: 1. Lower lung volumes with possible mild pulmonary interstitial
edema.
2. Stable probable hiatal hernia.
3. Stable heart size and aortic tortuosity.
